# Patient Record
Sex: Female | Born: 1980 | Race: White | Hispanic: No | Marital: Single | Smoking: Never smoker
Health system: Southern US, Community
[De-identification: ages and names within clinical notes are randomized; demographics above are authoritative.]

## PROBLEM LIST (undated history)

## (undated) DIAGNOSIS — R519 Headache, unspecified: Secondary | ICD-10-CM

## (undated) DIAGNOSIS — M1990 Unspecified osteoarthritis, unspecified site: Secondary | ICD-10-CM

## (undated) DIAGNOSIS — I10 Essential (primary) hypertension: Secondary | ICD-10-CM

## (undated) DIAGNOSIS — F32A Depression, unspecified: Secondary | ICD-10-CM

## (undated) DIAGNOSIS — T8859XA Other complications of anesthesia, initial encounter: Secondary | ICD-10-CM

## (undated) DIAGNOSIS — F419 Anxiety disorder, unspecified: Secondary | ICD-10-CM

## (undated) HISTORY — PX: ABDOMINAL HYSTERECTOMY: SHX81

---

## 2004-12-21 ENCOUNTER — Emergency Department: Payer: Self-pay | Admitting: Unknown Physician Specialty

## 2004-12-23 ENCOUNTER — Emergency Department: Payer: Self-pay | Admitting: Unknown Physician Specialty

## 2004-12-27 ENCOUNTER — Ambulatory Visit: Payer: Self-pay

## 2005-01-03 ENCOUNTER — Ambulatory Visit: Payer: Self-pay

## 2005-01-07 ENCOUNTER — Emergency Department: Payer: Self-pay | Admitting: Emergency Medicine

## 2005-10-28 ENCOUNTER — Ambulatory Visit: Payer: Self-pay | Admitting: Obstetrics and Gynecology

## 2006-11-19 ENCOUNTER — Ambulatory Visit: Payer: Self-pay | Admitting: Obstetrics and Gynecology

## 2007-01-05 ENCOUNTER — Encounter: Payer: Self-pay | Admitting: Obstetrics and Gynecology

## 2009-11-20 ENCOUNTER — Encounter: Payer: Self-pay | Admitting: Obstetrics and Gynecology

## 2009-12-07 ENCOUNTER — Encounter: Payer: Self-pay | Admitting: Maternal & Fetal Medicine

## 2009-12-28 ENCOUNTER — Emergency Department: Payer: Self-pay | Admitting: Emergency Medicine

## 2010-01-04 ENCOUNTER — Encounter: Payer: Self-pay | Admitting: Maternal & Fetal Medicine

## 2010-01-05 ENCOUNTER — Encounter: Payer: Self-pay | Admitting: Maternal & Fetal Medicine

## 2010-02-05 ENCOUNTER — Encounter: Payer: Self-pay | Admitting: Obstetrics & Gynecology

## 2010-02-15 ENCOUNTER — Encounter: Payer: Self-pay | Admitting: Ophthalmology

## 2010-02-19 ENCOUNTER — Encounter: Payer: Self-pay | Admitting: Maternal and Fetal Medicine

## 2010-07-03 ENCOUNTER — Observation Stay: Payer: Self-pay | Admitting: Obstetrics and Gynecology

## 2010-07-05 ENCOUNTER — Observation Stay: Payer: Self-pay

## 2010-07-12 ENCOUNTER — Ambulatory Visit: Payer: Self-pay | Admitting: Obstetrics and Gynecology

## 2010-07-13 ENCOUNTER — Inpatient Hospital Stay: Payer: Self-pay | Admitting: Obstetrics and Gynecology

## 2011-07-09 HISTORY — PX: ABDOMINAL HYSTERECTOMY: SHX81

## 2011-09-05 ENCOUNTER — Encounter: Payer: Self-pay | Admitting: Obstetrics and Gynecology

## 2011-12-12 ENCOUNTER — Observation Stay: Payer: Self-pay | Admitting: Obstetrics and Gynecology

## 2012-02-17 ENCOUNTER — Observation Stay: Payer: Self-pay

## 2012-02-17 LAB — URINALYSIS, COMPLETE
Bilirubin,UR: NEGATIVE
Nitrite: NEGATIVE
Protein: NEGATIVE
WBC UR: 4 /HPF (ref 0–5)

## 2012-02-18 ENCOUNTER — Observation Stay: Payer: Self-pay | Admitting: Obstetrics and Gynecology

## 2012-02-19 LAB — URINE CULTURE

## 2012-02-21 IMAGING — US ULTRAOUND OB LIMITED - NRPT [facility]
1 series · 14 of 21 positions shown · non-contrast
Comparison: none

[Series 1: ultraound ob limited - nrpt [facility] · 14 of 21 slices shown]
[im 1/21]
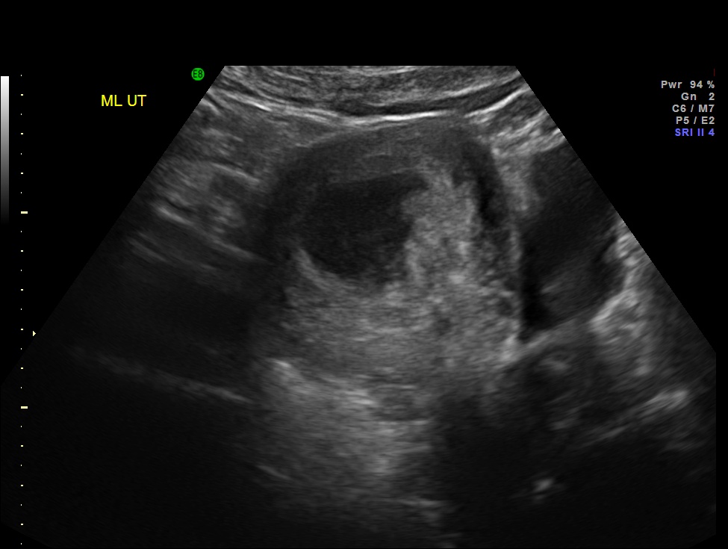
[im 3/21]
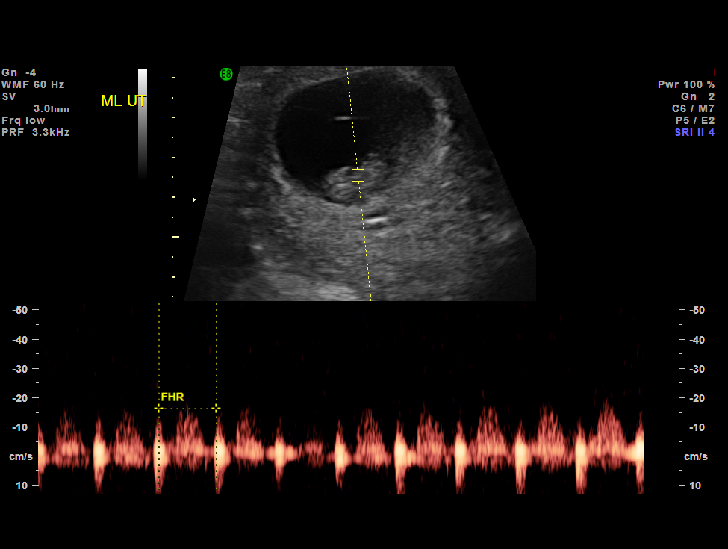
[im 4/21]
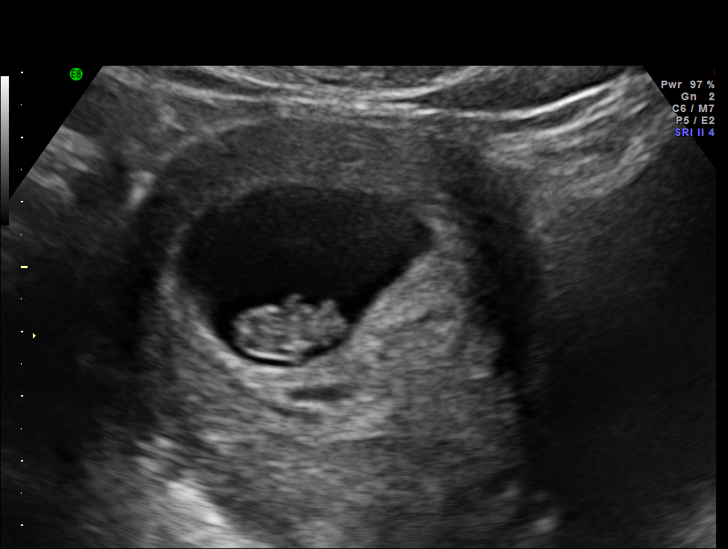
[im 6/21]
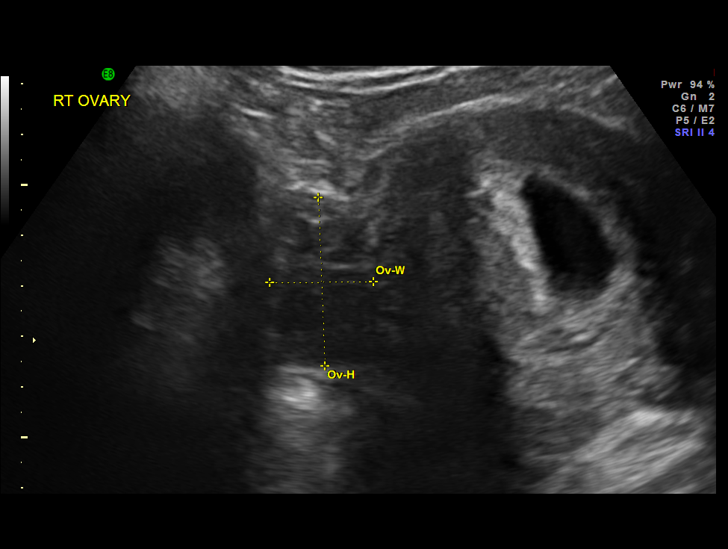
[im 7/21]
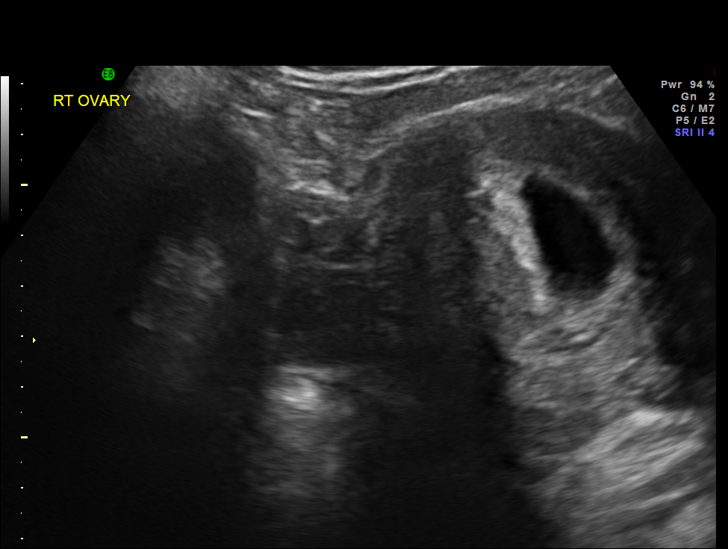
[im 9/21]
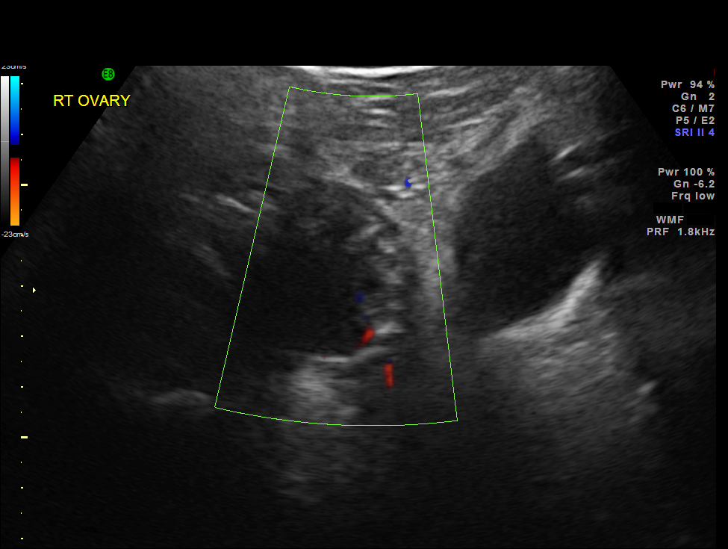
[im 10/21]
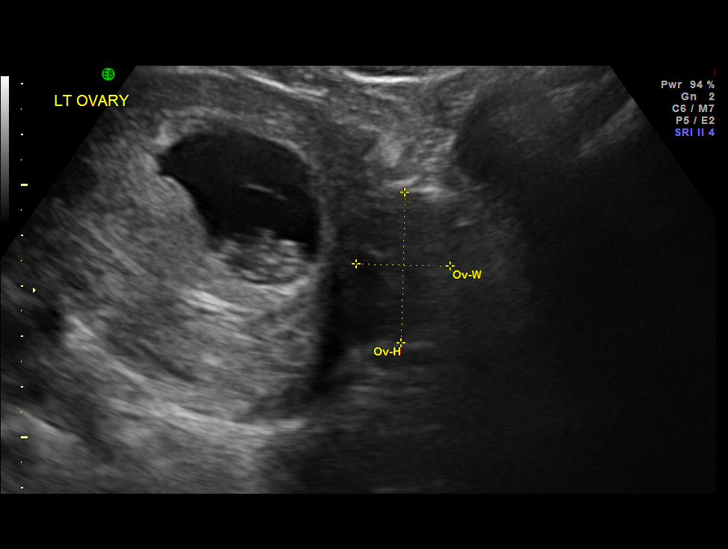
[im 12/21]
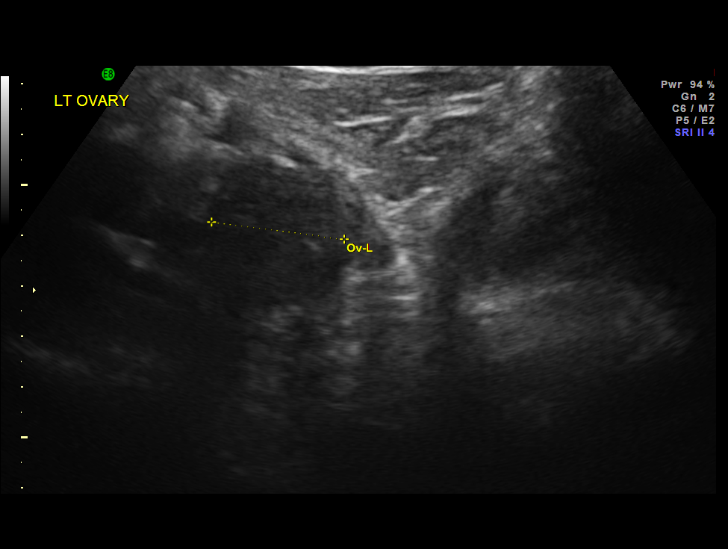
[im 13/21]
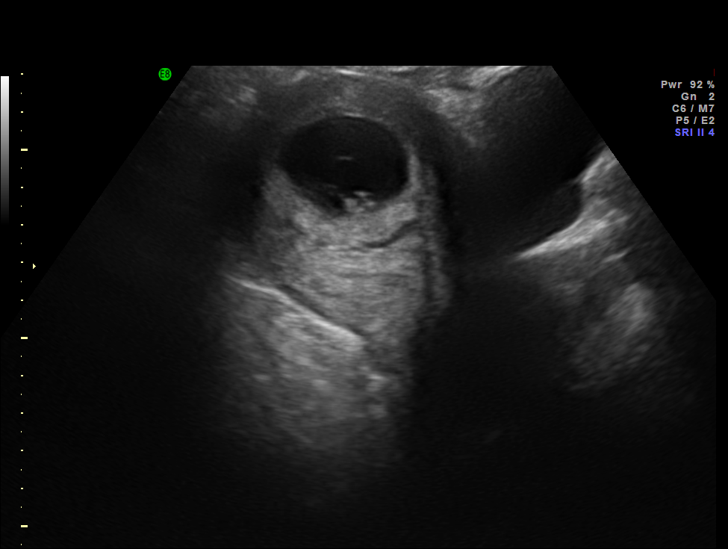
[im 15/21]
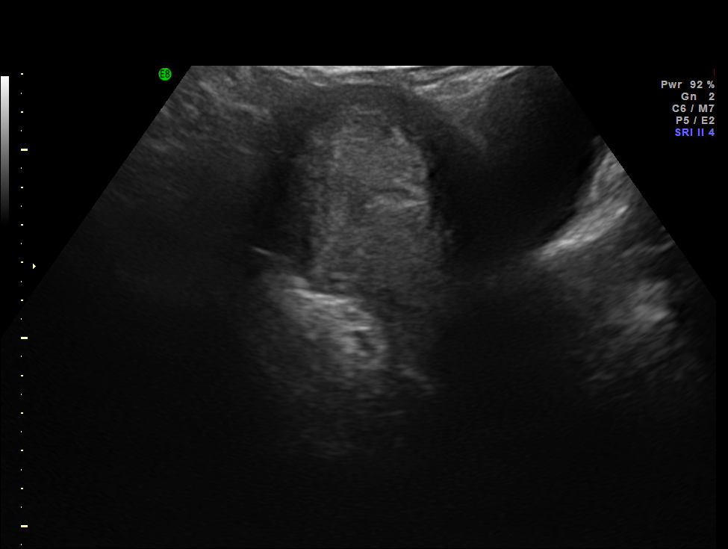
[im 16/21]
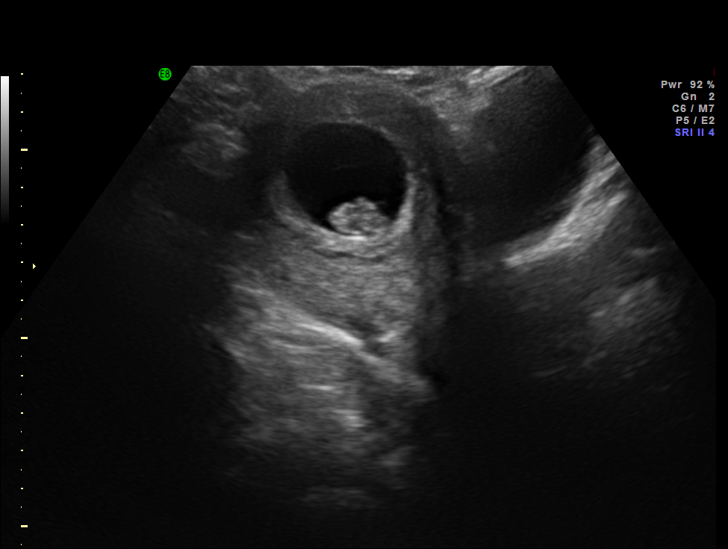
[im 18/21]
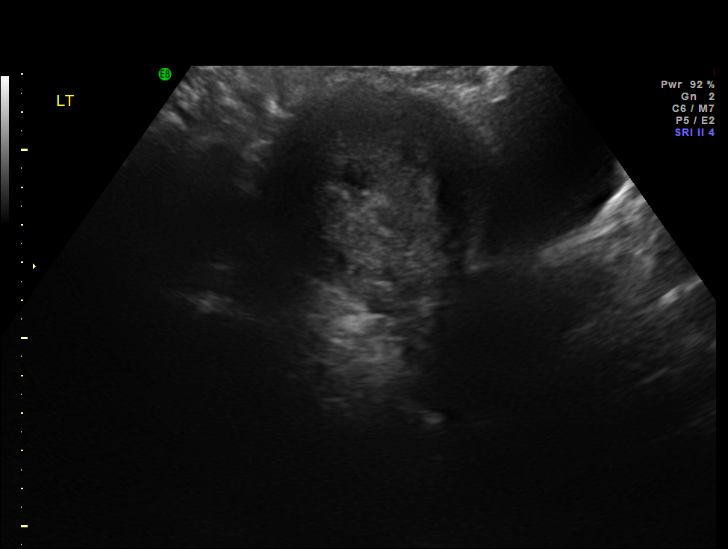
[im 19/21]
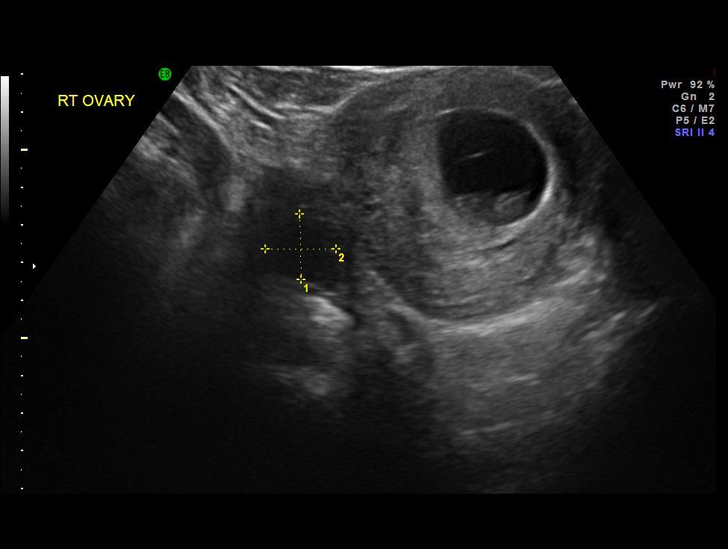
[im 21/21]
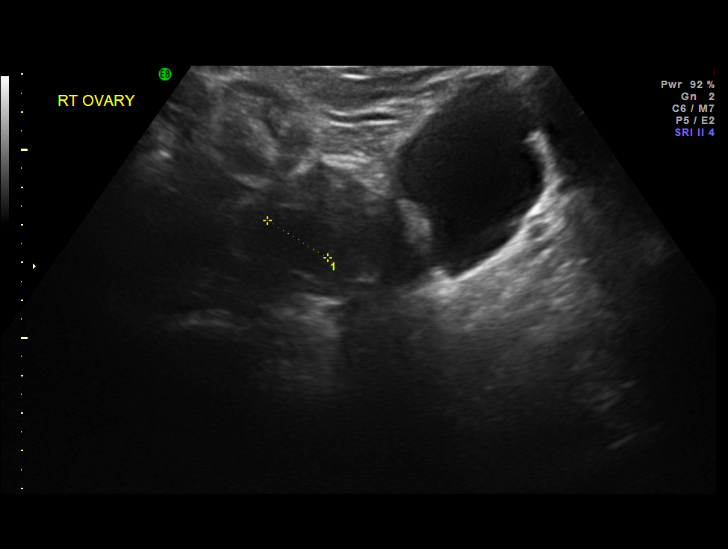

[14 of 21 positions shown; findings below may reference images not displayed]

IMAGES IMPORTED FROM THE [employer] WORKFLOW SYSTEM
NO DICTATION FOR STUDY

## 2012-03-02 ENCOUNTER — Ambulatory Visit: Payer: Self-pay | Admitting: Obstetrics and Gynecology

## 2012-03-02 LAB — CBC WITH DIFFERENTIAL/PLATELET
Basophil #: 0 10*3/uL (ref 0.0–0.1)
Basophil %: 0.2 %
HCT: 28.6 % — ABNORMAL LOW (ref 35.0–47.0)
HGB: 9.7 g/dL — ABNORMAL LOW (ref 12.0–16.0)
Lymphocyte #: 1.3 10*3/uL (ref 1.0–3.6)
MCH: 28.6 pg (ref 26.0–34.0)
MCV: 85 fL (ref 80–100)
Monocyte #: 0.4 x10 3/mm (ref 0.2–0.9)
Monocyte %: 5.5 %
Neutrophil #: 5.3 10*3/uL (ref 1.4–6.5)
Neutrophil %: 74.7 %
Platelet: 126 10*3/uL — ABNORMAL LOW (ref 150–440)
RBC: 3.38 10*6/uL — ABNORMAL LOW (ref 3.80–5.20)
RDW: 15 % — ABNORMAL HIGH (ref 11.5–14.5)
WBC: 7.1 10*3/uL (ref 3.6–11.0)

## 2012-03-04 ENCOUNTER — Inpatient Hospital Stay: Payer: Self-pay | Admitting: Obstetrics and Gynecology

## 2012-03-20 IMAGING — US US OB NUCHAL TRANSLUCENCY 1ST GEST - [facility] NRPT
1 series · 14 of 28 positions shown · non-contrast
Comparison: none

[Series 1: us ob nuchal translucency 1st gest - [facility] nrpt · 14 of 47 slices shown]
[im 2/47]
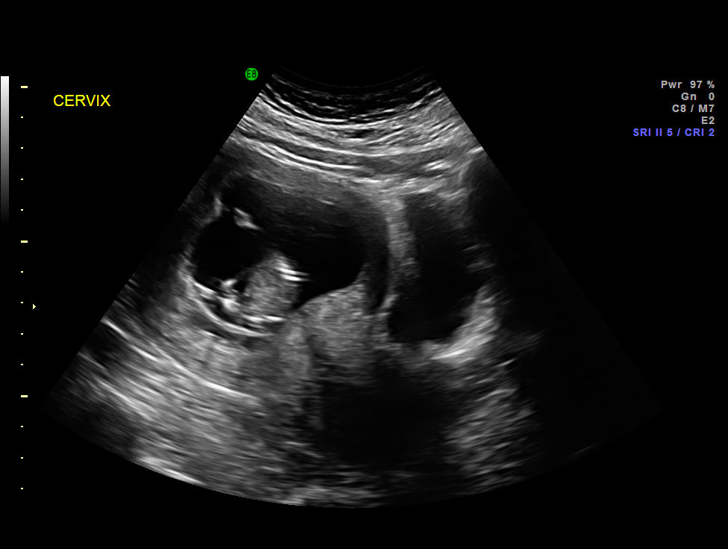
[im 6/47]
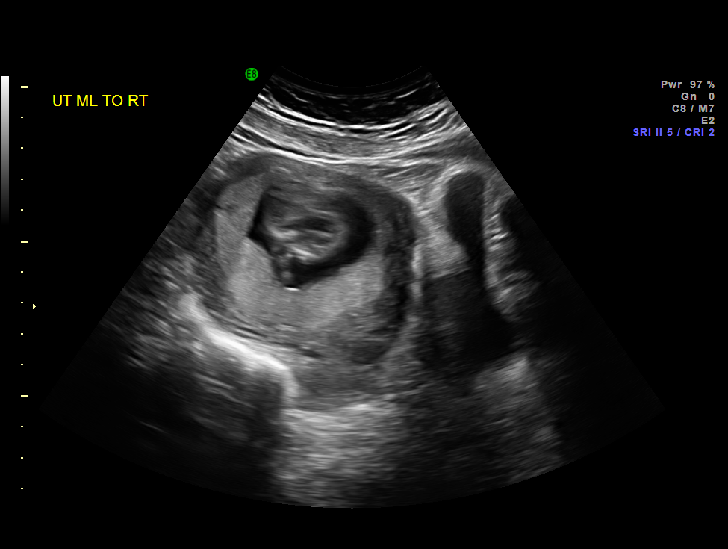
[im 9/47]
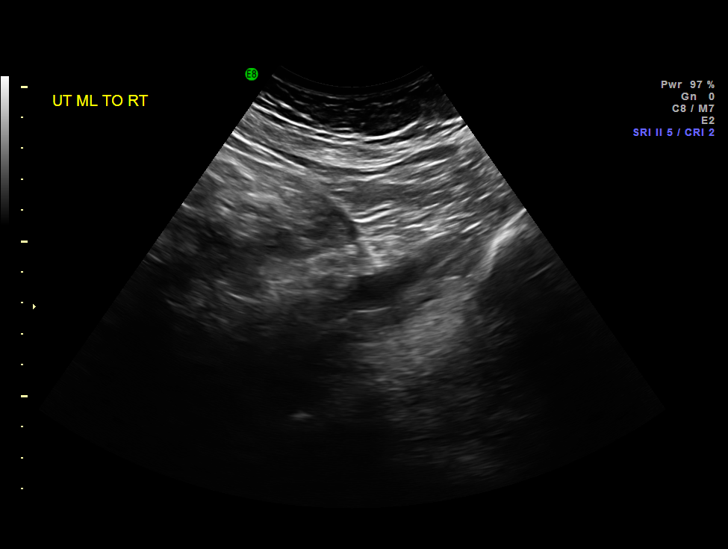
[im 12/47]
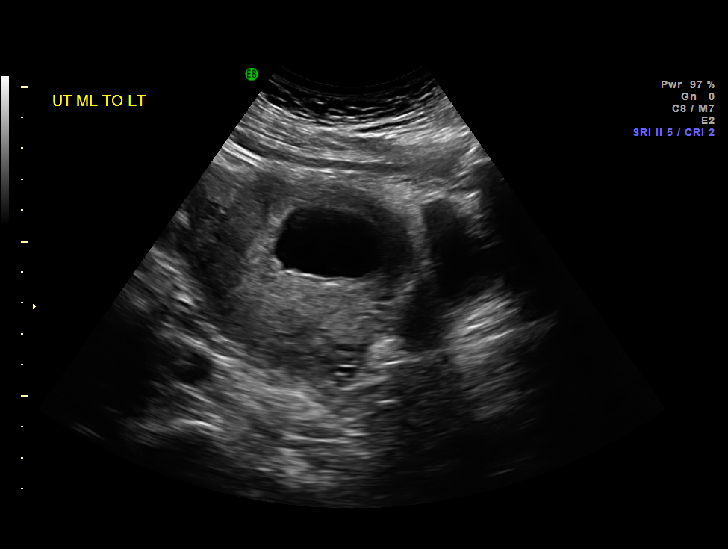
[im 16/47]
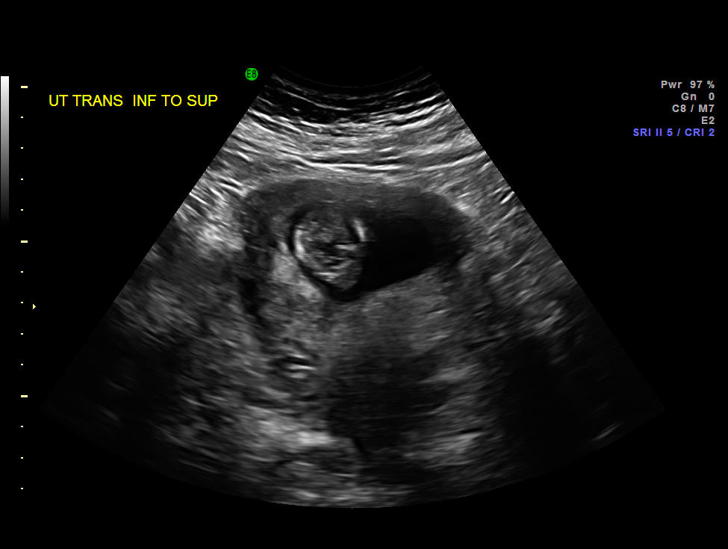
[im 19/47]
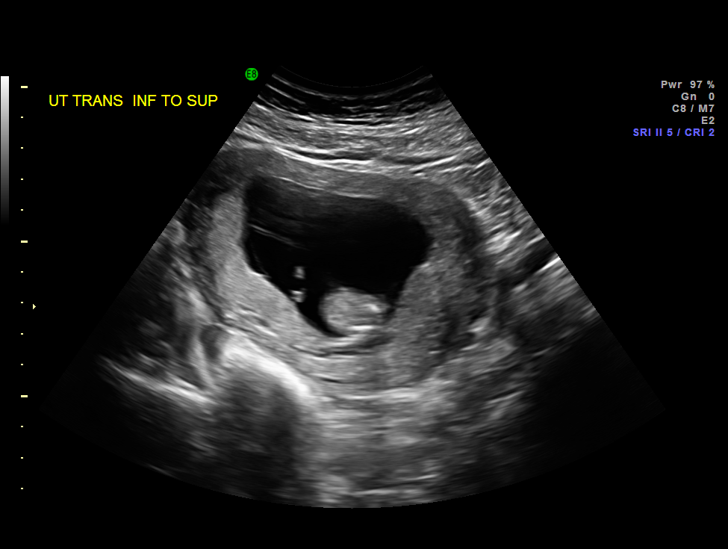
[im 23/47]
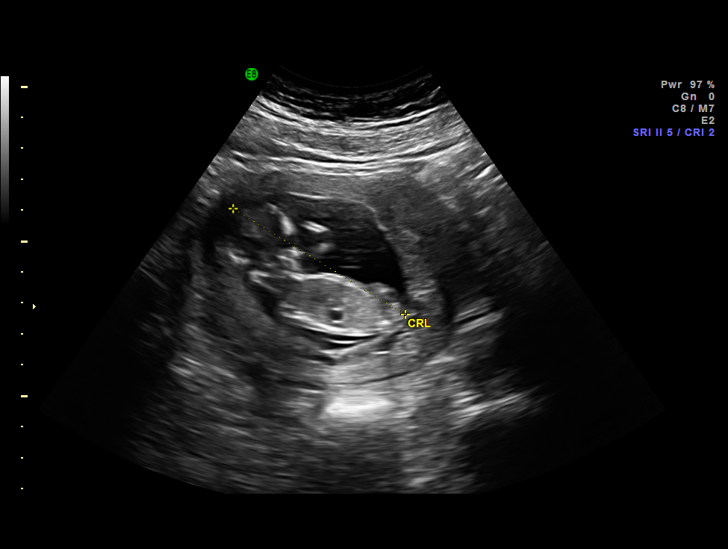
[im 26/47]
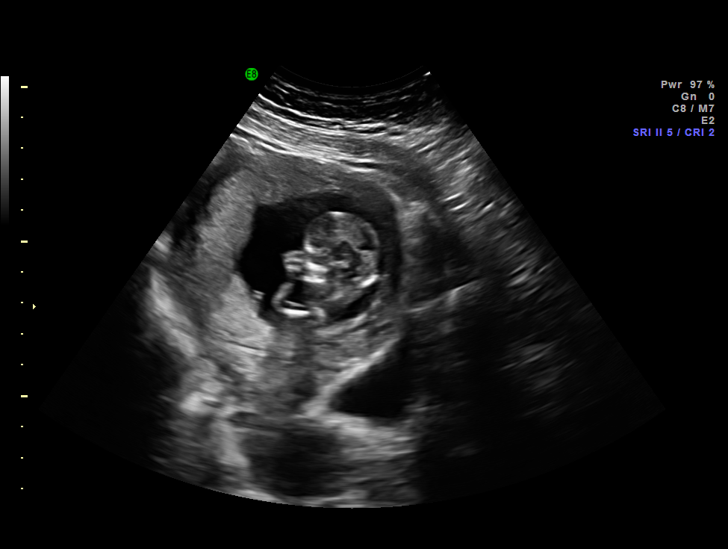
[im 29/47]
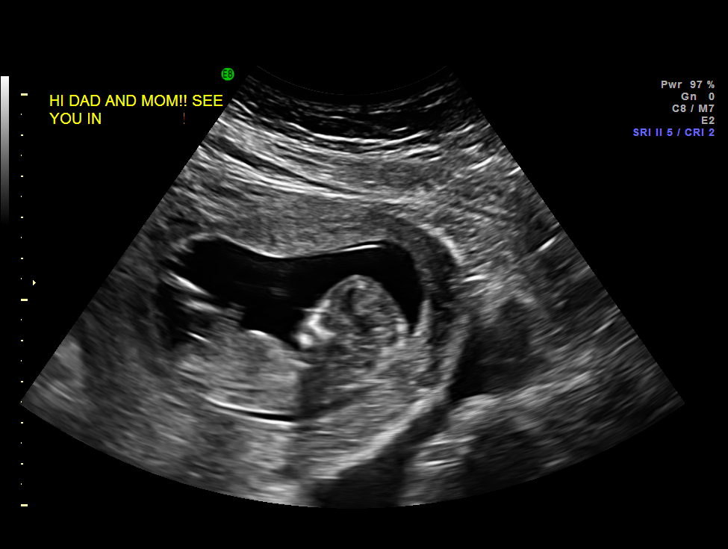
[im 33/47]
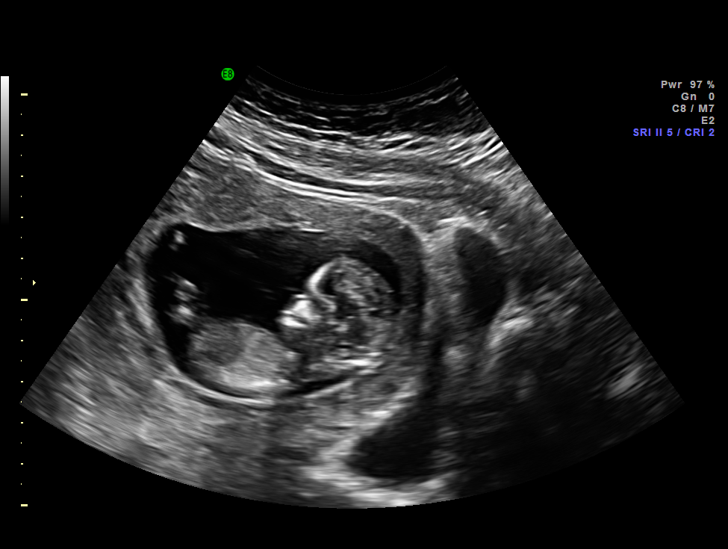
[im 36/47]
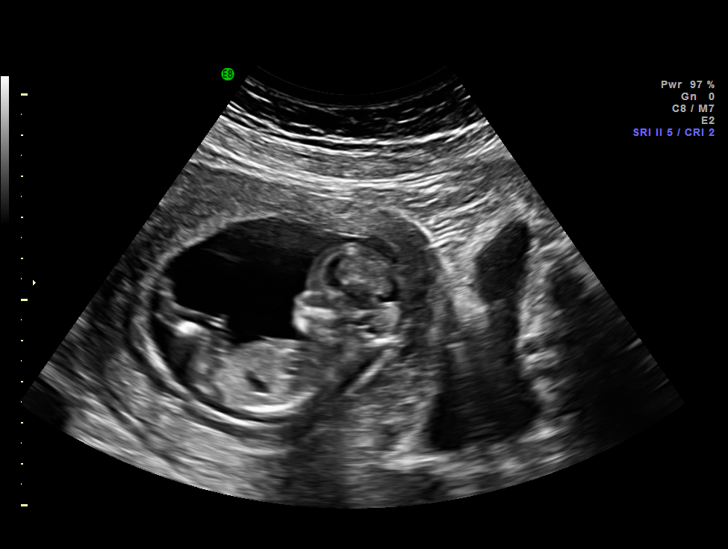
[im 40/47]
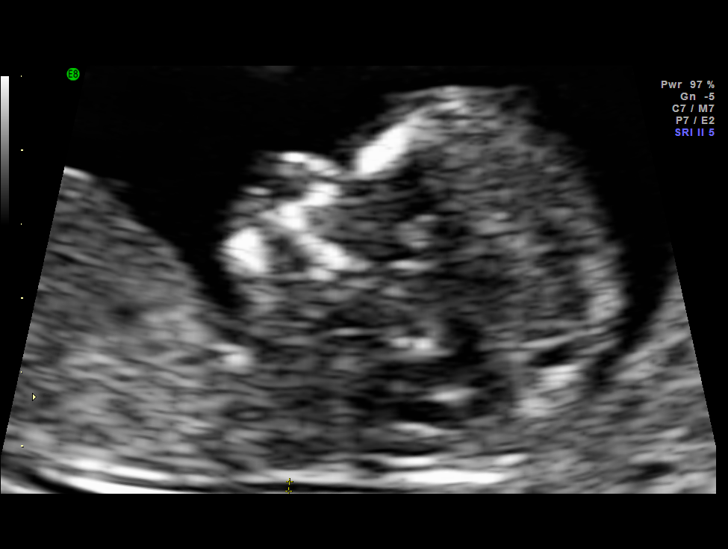
[im 43/47]
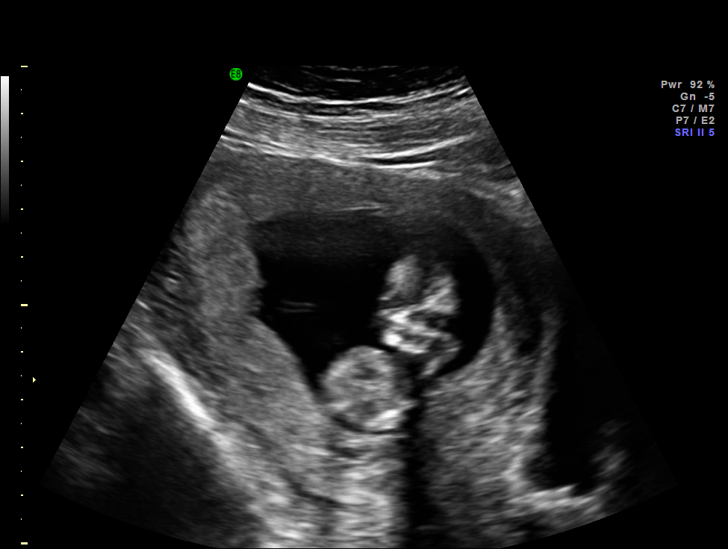
[im 47/47]
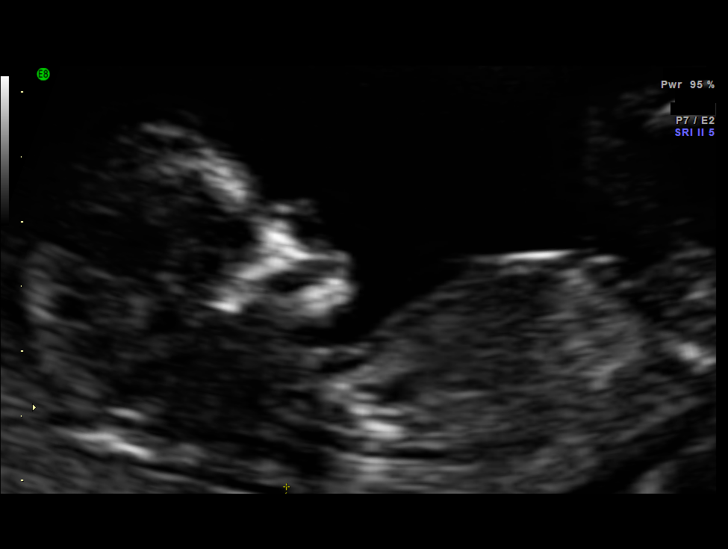

[14 of 28 positions shown; findings below may reference images not displayed]

IMAGES IMPORTED FROM THE [employer] WORKFLOW SYSTEM
NO DICTATION FOR STUDY

## 2013-05-18 ENCOUNTER — Ambulatory Visit: Payer: Self-pay | Admitting: Obstetrics and Gynecology

## 2013-05-18 LAB — URINALYSIS, COMPLETE
Glucose,UR: NEGATIVE mg/dL (ref 0–75)
Ketone: NEGATIVE
Nitrite: NEGATIVE
RBC,UR: <1 /HPF (ref 0–5)
Squamous Epithelial: 2
WBC UR: 3 /HPF (ref 0–5)

## 2013-05-24 ENCOUNTER — Ambulatory Visit: Payer: Self-pay | Admitting: Obstetrics and Gynecology

## 2013-05-25 LAB — PATHOLOGY REPORT

## 2013-11-02 ENCOUNTER — Emergency Department: Payer: Self-pay | Admitting: Emergency Medicine

## 2014-10-25 NOTE — Discharge Summary (Signed)
PATIENT NAME:  Wendy Phillips, Wendy Phillips MR#:  [id number] DATE OF BIRTH:  [birth year]  DATE OF ADMISSION:  03/04/2012 DATE OF DISCHARGE:  03/07/2012  PRINCIPLE PROCEDURES:  1. Elective repeat cesarean section. 2. Bilateral tubal ligation.   HOSPITAL COURSE: Hospital course uncomplicated. Postoperative day #1 hematocrit 23.5%. She is discharged to home on postoperative day #2 in good condition. Vital signs stable.   DISCHARGE MEDICATIONS:  1. Percocet. 2. Prenatal vitamins.   FOLLOWUP: She will follow up with Dr. Ouida Sills in two weeks for wound check or before if she has wound drainage, fevers, nausea, or vomiting.    ____________________________ Boykin Nearing, MD tjs:ap D: 03/13/2012 09:19:05 ET T: 03/16/2012 11:02:57 ET JOB#: [id number]  cc: Boykin Nearing, MD, <Dictator> Boykin Nearing MD ELECTRONICALLY SIGNED 03/17/2012 19:36

## 2014-10-25 NOTE — Op Note (Signed)
PATIENT NAME:  Wendy Phillips, Wendy Phillips MR#:  [id number] DATE OF BIRTH:  22-Apr-1981  DATE OF PROCEDURE:  03/04/2012  PREOPERATIVE DIAGNOSES:  1. Elective repeat cesarean section.  2. Elective permanent sterilization.   POSTOPERATIVE DIAGNOSES:    1. Elective repeat cesarean section.  2. Elective permanent sterilization.   PROCEDURE:  1. Repeat low transverse cesarean section.  2. Pomeroy bilateral tubal ligation.  3. On-Q pump placement.   SURGEON: Boykin Nearing, M.D.   FIRST ASSISTANT: Glenis Smoker, CNM   ANESTHESIA:  Spinal.  PROCEDURE: After adequate spinal anesthesia, the patient was placed in the dorsal supine position with a hip roll under the right side. The patient's abdomen was prepped and draped in normal sterile fashion. A Pfannenstiel incision was made two fingerbreadths above the symphysis pubis. Sharp dissection was used to identify the fascia. The fascia was opened in the midline and opened in a transverse fashion. The superior aspect of the fascia was grasped with Kocher  clamps and the recti muscles dissected free. The inferior aspect of the fascia was grasped with Kocher clamps and the pyramidalis muscle was dissected free. Entry into the peritoneal cavity was accomplished sharply. Several adhesions were noted of the vesicouterine peritoneal fold. These were taken down sharply. The bladder flap was then created and the bladder was reflected inferiorly. A low transverse uterine incision was made. Upon entry into the endometrial cavity, clear fluid resulted. The fetal head was delivered through the incision without difficulty, and shoulders and body delivered without difficulty. The infant was passed to Dr. Tona Sensing, after clamping the cord. Apgars 9 and 9. The uterus was exteriorized. The placenta was removed manually and the endometrial cavity was wiped clean with laparotomy tape. The cervix was opened with a ring forceps and this was passed off the operative field. The uterine  incision was closed with 1 chromic suture in a running locking fashion with good approximation of edges. Good hemostasis was noted. Attention was directed to the patient's right fallopian tube which was grasped at the midportion of the tube and two separate 0 plain gut sutures were applied and a 1.5-cm portion of the fallopian tube removed. A similar procedure was repeated on the patient's left fallopian tube. After placing 2-0 plain gut sutures, a 1.5-cm portion of the fallopian tube was removed. Good hemostasis was noted. The posterior cul-de-sac was suctioned and the uterus was placed back into the abdominal cavity. The paracolic gutter was wiped clean with the laparotomy tape.  The tubal ligation sites appeared hemostatic and the uterine incision appeared hemostatic. Interceed was placed over the uterine scar in a T-shaped fashion. Of note, there were some omental adhesions that were taken down sharply from the uterus. The On-Q pump was then placed. Catheters were fed infraumbilically subfascially. The fascia was then closed above these catheters. The fascia was intact. The subcutaneous tissues were irrigated and bovied for hemostasis and skin was then reapproximated with the Insorb absorbable staples and the On-Q pump catheters were then secured with Dermabond and Steri-Strips and Tegaderm was placed above this. Each catheter was then loaded with 5 mL of 0.5% Marcaine. There were no complications.  Estimated blood loss was 450 mL.  The patient tolerated the procedure well and was taken to the recovery room in good condition.    ____________________________ Boykin Nearing, MD tjs:bjt D: 03/04/2012 08:47:18 ET T: 03/04/2012 11:09:06 ET JOB#: [id number]  cc: Boykin Nearing, MD, <Dictator> Boykin Nearing MD ELECTRONICALLY SIGNED 03/05/2012 11:28

## 2014-10-28 NOTE — Discharge Summary (Signed)
Dates of Admission and Diagnosis:  Date of Admission 24-May-2013   Date of Discharge 25-May-2013   Admitting Diagnosis polymen/mennorhagia   Final Diagnosis same    Chief Complaint/History of Present Illness LSH for above, with LOA   Hospital Course:  Hospital Course nausea morning of day 1 , resolved   Condition on Discharge Good   Physician's Instructions:  Diet Regular   Activity Limitations No exertional activity  No heavy lifting   Return to Work 2 weeks   Time frame for Follow Up Appointment 1-2 weeks   Electronic Signatures: Edison Nasuti (MD)  (Signed [phone] 14:03)  Authored: ADMISSION DATE AND DIAGNOSIS, CHIEF COMPLAINT/HPI, HOSPITAL COURSE, PATIENT INSTRUCTIONS   Last Updated: 18-Nov-14 14:03 by Edison Nasuti (MD)

## 2014-10-28 NOTE — Op Note (Signed)
PATIENT NAME:  Wendy Phillips, Wendy Phillips MR#:  [id number] DATE OF BIRTH:  1980/11/18  DATE OF PROCEDURE:  05/24/2013  PREOPERATIVE DIAGNOSES: 1.  Dysmenorrhea. 2.  Menorrhagia.  3.  Right-sided pain, normal Paps, normal anatomy.  POSTOPERATIVE DIAGNOSES: 1.  Dysmenorrhea. 2.  Menorrhagia.  3.  Right-sided pain, normal Paps, normal anatomy.  PROCEDURE:  1.  Laparoscopic supracervical hysterectomy. 2.  Bilateral salpingectomy. 3.  Lysis of adhesions. 4.  Cystoscopy.   SURGEON: Ricky L. Amalia Hailey, M.D.   ASSISTANT: Dr. Ouida Sills.   ANESTHESIA: General endotracheal.   ESTIMATED BLOOD LOSS: 350 mL.   COMPLICATIONS: None.   DRAINS: Foley.   PROCEDURE IN DETAIL: The patient was consented, taken to the operating room and placed in the supine position where anesthesia was initiated and placed in the dorsal lithotomy position in Garrison stirrups, prepped and draped in the usual sterile fashion. Cervix was visualized. A single-tooth and sound were placed in the usual fashion. Foley catheter was placed.   A transverse 1 cm infraumbilical incision was made through which an 11 mm port was placed and pneumoperitoneum was established. The patient was placed in Trendelenburg position. Under direct visualization, I placed 11 ports in bilateral lower quadrants. I immediately noted adherent left adnexa and adhesions from the mid anterior uterus down toward the cervix.   Round ligament was identified and divided as was the uteroovarian ligament. Staying close to the uterus, I dissected down along the broad ligament and cauterized the uterine artery. I carefully dissected free from the anterior adhesions taking care to stay away from the bladder.  Proceeding in a similar fashion on the right, I cauterized the right uterine artery. Entry was made and a stump was created on the right.   Sound was removed and uterus was cut free from underlying cervical stump.   There was persistent oozing near the left  uterosacral, which was controlled with light electrocautery with a Kleppinger and also with the Harmonic scalpel.   We then removed bilateral fallopian tubes using Harmonic scalpel.   The uterus was removed in morcellator fashion after morcellator was placed through the left lower port site wound. This was all sent for permanent pathology together.   There was persistent oozing from around the cervix. The endocervical canal was then cauterized with hand of operator in the vagina to ensure no upper vaginal trauma. There was no vigorous bleeding noted at this time. Surgifoam was placed.   I asked for IV indigo carmine to be given - indigo carmine is on a back course and methylene blue was given one half of a vial.   Cystoscopy was then carried out, which showed good spillage of methylene blue from bilateral internal ureteral orifices with no evidence of bladder trauma.   Ports were removed. The areas seemed to be hemostatic. Incisions were closed with deep of 0 Vicryl. Steri-Strips and Band-Aids were applied.   I had asked the patient preoperatively to mark the area near her incision that was tender. I did inject 30 mL of 1% lidocaine with epinephrine using an 18-gauge needle bevel to release some of the scar tissue. It did appear to be somewhat tight from her previous cesarean section. I did this at the end of the case.   All instrument, needle, and sponge counts were correct. The patient tolerated the procedure well. I anticipate a routine postoperative course. We will leave the Foley in at least until this afternoon given the fact that she had regular oozing throughout the case.  ____________________________ Rockey Situ. Amalia Hailey, MD rle:aw D: 05/24/2013 11:58:45 ET T: 05/24/2013 12:21:37 ET JOB#: [id number]  cc: Ricky L. Amalia Hailey, MD, <Dictator> Selmer Dominion MD ELECTRONICALLY SIGNED 05/24/2013 14:36

## 2014-11-01 ENCOUNTER — Ambulatory Visit (INDEPENDENT_AMBULATORY_CARE_PROVIDER_SITE_OTHER): Payer: 59

## 2014-11-01 ENCOUNTER — Encounter: Payer: Self-pay | Admitting: Podiatry

## 2014-11-01 ENCOUNTER — Ambulatory Visit (INDEPENDENT_AMBULATORY_CARE_PROVIDER_SITE_OTHER): Payer: 59 | Admitting: Podiatry

## 2014-11-01 VITALS — BP 127/90 | HR 89 | Resp 16 | Ht 62.0 in | Wt 170.0 lb

## 2014-11-01 DIAGNOSIS — S93601A Unspecified sprain of right foot, initial encounter: Secondary | ICD-10-CM

## 2014-11-01 DIAGNOSIS — S92301A Fracture of unspecified metatarsal bone(s), right foot, initial encounter for closed fracture: Secondary | ICD-10-CM | POA: Diagnosis not present

## 2014-11-01 NOTE — Progress Notes (Signed)
Subjective:     Patient ID: Wendy Phillips, female   DOB: [birth year], 34 y.o.   MRN: [id number]  HPI 34 year old female presents the office they with complaints of right foot pain. She states that yesterday she fell into a hole in her right foot. She states that last night she didn't go to urgent care and she was told they were going to send the x-rays off and to follow-up with [place] today. She states that she is unable to bear weight to her foot due to the pain. She also states the foot feels somewhat numb since the injury. Wrapping in an ACE bandage helps. She has been taking OTC anti-inflammatories and icing the area. No other injuries at the time of the accident. No other complaints at this time.  Review of Systems  All other systems reviewed and are negative.      Objective:   Physical Exam AAO x3, NAD DP/PT pulses palpable bilaterally, CRT less than 3 seconds Protective sensation intact with Simms Weinstein monofilament, Achilles tendon reflex intact. There is subjective numbness to her foot mostly over the lateral aspect on the right.  There is tenderness to palpation overlying the lateral aspect of the foot particularly the fifth metatarsal base. There is mild discomfort along the course of the peroneal tendons inferior to lateral malleolus. There is mild discomfort with eversion. Peroneal tendons appear to be intact. There is also mild diffuse tenderness overlying the fourth metatarsal and lateral aspect of the foot. No other specific area pinpoint bony tenderness. There is mild edema overlying the lateral aspect of the foot as well without any associated erythema or increase in warmth.  No areas of tenderness to bilateral lower extremities. MMT 4/5 on the right and 5/5 on the left.  No open lesions or pre-ulcerative lesions.  No overlying edema, erythema, increase in warmth to bilateral lower extremities.  No pain with calf compression, swelling, warmth, erythema bilaterally.       Assessment:     34 year old female with right 5th metatarsal base avulsion fracture    Plan:     -X-rays were obtained and reviewed with the patient -Treatment options were discussed include alternatives, risks, competitions. -At this time recommended immobilization in a Cam Walker. She states that she has a boot at home and I recommended continue to wear this at all times. -She was given a prescription for pain medication urgent care; take this as needed -Ice to the area -Can try WBAT however at this time she states it is too painful; she presents today with crutches and she can continue this.  -Follow-up in 2 weeks, or sooner should any problems arise. In the meantime call with any questions/concerns/change in symptoms.

## 2014-11-11 ENCOUNTER — Telehealth: Payer: Self-pay | Admitting: Podiatry

## 2014-11-11 NOTE — Telephone Encounter (Signed)
Please call ASAP

## 2014-11-14 ENCOUNTER — Other Ambulatory Visit: Payer: Self-pay | Admitting: *Deleted

## 2014-11-14 MED ORDER — TRAMADOL HCL 50 MG PO TABS
50.0000 mg | ORAL_TABLET | Freq: Three times a day (TID) | ORAL | Status: DC | PRN
Start: 2014-11-14 — End: 2015-01-24

## 2014-11-22 ENCOUNTER — Ambulatory Visit (INDEPENDENT_AMBULATORY_CARE_PROVIDER_SITE_OTHER): Payer: 59

## 2014-11-22 ENCOUNTER — Ambulatory Visit (INDEPENDENT_AMBULATORY_CARE_PROVIDER_SITE_OTHER): Payer: 59 | Admitting: Podiatry

## 2014-11-22 VITALS — BP 125/87 | HR 89 | Resp 16

## 2014-11-22 DIAGNOSIS — S92301A Fracture of unspecified metatarsal bone(s), right foot, initial encounter for closed fracture: Secondary | ICD-10-CM

## 2014-11-22 DIAGNOSIS — S92302D Fracture of unspecified metatarsal bone(s), left foot, subsequent encounter for fracture with routine healing: Secondary | ICD-10-CM

## 2014-11-22 MED ORDER — MELOXICAM 7.5 MG PO TABS: 7.5000 mg | ORAL_TABLET | Freq: Every day | ORAL | Status: DC

## 2014-11-22 NOTE — Progress Notes (Signed)
Patient ID: Wendy Phillips, female   DOB: [birth year], 34 y.o.   MRN: [id number]  Subjective: 34 year old female presents the office they for followup evaluation of right fifth metatarsal base avulsion fracture. She states that she continues to wear the cam walker at all times. She does that her pain has improved although she does have some discomfort. She did say that she try to go back into a regular shoe however she was unable to due to the pain. She currently has no numbness the fifth toe however this does appear to be resolving. She continues with Ace wrap for swelling which helps. Denies any systemic complaints such as fevers, chills, nausea, vomiting. No acute changes since last appointment, and no other complaints at this time.   Objective: AAO x3, NAD DP/PT pulses palpable bilaterally, CRT less than 3 seconds Protective sensation intact with Simms Weinstein monofilament First interspace on the left fifth metatarsal base there is mild edema and ecchymosis overlying this area. There is no other areas of pinpoint bony tenderness or pain the vibratory sensation of bilateral lower extremity. No other areas of edema, erythema, increase in warmth.  MMT 5/5, ROM WNL. No open lesions or pre-ulcerative lesions.  No pain with calf compression, swelling, warmth, erythema  Assessment: 34 year old female left fifth metatarsal base avulsion fracture, improving  Plan: -x-rays were obtained and reviewed with the patient. -All treatment options discussed with the patient including all alternatives, risks, complications.  -At this time recommended continued immobilization in a Cam Walker due to pain. -Ice and elevation -Prescribed meloxicam. Discussed side effects the medication and directed to stop any are to occur call the office. -dispensed compression anklet. -Follow-up in 3 weeks. At that time repeat x-rays will be obtained. We'll likely transition back into a regular shoe if tolerated.  -Patient  encouraged to call the office with any questions, concerns, change in symptoms.

## 2014-11-24 ENCOUNTER — Ambulatory Visit: Payer: 59 | Admitting: Podiatry

## 2014-12-13 ENCOUNTER — Ambulatory Visit: Payer: 59 | Admitting: Podiatry

## 2014-12-29 ENCOUNTER — Telehealth: Payer: Self-pay | Admitting: Podiatry

## 2014-12-29 NOTE — Telephone Encounter (Signed)
PT CALLED ASKING TO BE SEEN TODAY. PT HAD FRACTURED HER RIGHT FOOT IN April AND IS STILL HAVING ISSUES WITH IT. PT TAKING TRAMADOL AND IT IS NOT HELPING WITH THE PAIN. SHE SAID WHEN SHE IS SITTING AND GETS UP TO WALK SHE IS IN A LOT OF PAIN. PT STATES SHE IS NOT SURE IF IT IS FROM FRACTURE OR FROM PLANTAR FASCITIS BUT WANTS TO BE SEEN TODAY. I OFFERED HER AN APPT FOR Tuesday BUT PT SAID SHE COULD NOT WAIT.

## 2015-01-03 ENCOUNTER — Ambulatory Visit (INDEPENDENT_AMBULATORY_CARE_PROVIDER_SITE_OTHER): Payer: 59 | Admitting: Podiatry

## 2015-01-03 ENCOUNTER — Encounter: Payer: Self-pay | Admitting: Podiatry

## 2015-01-03 ENCOUNTER — Ambulatory Visit (INDEPENDENT_AMBULATORY_CARE_PROVIDER_SITE_OTHER): Payer: 59

## 2015-01-03 VITALS — BP 113/77 | HR 89 | Resp 16 | Ht 62.0 in | Wt 180.0 lb

## 2015-01-03 DIAGNOSIS — M779 Enthesopathy, unspecified: Secondary | ICD-10-CM

## 2015-01-03 DIAGNOSIS — M722 Plantar fascial fibromatosis: Secondary | ICD-10-CM | POA: Diagnosis not present

## 2015-01-03 DIAGNOSIS — M79671 Pain in right foot: Secondary | ICD-10-CM

## 2015-01-03 DIAGNOSIS — S92302D Fracture of unspecified metatarsal bone(s), left foot, subsequent encounter for fracture with routine healing: Secondary | ICD-10-CM | POA: Diagnosis not present

## 2015-01-03 MED ORDER — TRIAMCINOLONE ACETONIDE 10 MG/ML IJ SUSP
10.0000 mg | Freq: Once | INTRAMUSCULAR | Status: AC
  Administered 2015-01-03: 10 mg

## 2015-01-03 NOTE — Patient Instructions (Signed)
Plantar Fasciitis (Heel Spur Syndrome) with Rehab The plantar fascia is a fibrous, ligament-like, soft-tissue structure that spans the bottom of the foot. Plantar fasciitis is a condition that causes pain in the foot due to inflammation of the tissue. SYMPTOMS   Pain and tenderness on the underneath side of the foot.  Pain that worsens with standing or walking. CAUSES  Plantar fasciitis is caused by irritation and injury to the plantar fascia on the underneath side of the foot. Common mechanisms of injury include:  Direct trauma to bottom of the foot.  Damage to a small nerve that runs under the foot where the main fascia attaches to the heel bone.  Stress placed on the plantar fascia due to bone spurs. RISK INCREASES WITH:   Activities that place stress on the plantar fascia (running, jumping, pivoting, or cutting).  Poor strength and flexibility.  Improperly fitted shoes.  Tight calf muscles.  Flat feet.  Failure to warm-up properly before activity.  Obesity. PREVENTION  Warm up and stretch properly before activity.  Allow for adequate recovery between workouts.  Maintain physical fitness:  Strength, flexibility, and endurance.  Cardiovascular fitness.  Maintain a health body weight.  Avoid stress on the plantar fascia.  Wear properly fitted shoes, including arch supports for individuals who have flat feet. PROGNOSIS  If treated properly, then the symptoms of plantar fasciitis usually resolve without surgery. However, occasionally surgery is necessary. RELATED COMPLICATIONS   Recurrent symptoms that may result in a chronic condition.  Problems of the lower back that are caused by compensating for the injury, such as limping.  Pain or weakness of the foot during push-off following surgery.  Chronic inflammation, scarring, and partial or complete fascia tear, occurring more often from repeated injections. TREATMENT  Treatment initially involves the use of  ice and medication to help reduce pain and inflammation. The use of strengthening and stretching exercises may help reduce pain with activity, especially stretches of the Achilles tendon. These exercises may be performed at home or with a therapist. Your caregiver may recommend that you use heel cups of arch supports to help reduce stress on the plantar fascia. Occasionally, corticosteroid injections are given to reduce inflammation. If symptoms persist for greater than 6 months despite non-surgical (conservative), then surgery may be recommended.  MEDICATION   If pain medication is necessary, then nonsteroidal anti-inflammatory medications, such as aspirin and ibuprofen, or other minor pain relievers, such as acetaminophen, are often recommended.  Do not take pain medication within 7 days before surgery.  Prescription pain relievers may be given if deemed necessary by your caregiver. Use only as directed and only as much as you need.  Corticosteroid injections may be given by your caregiver. These injections should be reserved for the most serious cases, because they may only be given a certain number of times. HEAT AND COLD  Cold treatment (icing) relieves pain and reduces inflammation. Cold treatment should be applied for 10 to 15 minutes every 2 to 3 hours for inflammation and pain and immediately after any activity that aggravates your symptoms. Use ice packs or massage the area with a piece of ice (ice massage).  Heat treatment may be used prior to performing the stretching and strengthening activities prescribed by your caregiver, physical therapist, or athletic trainer. Use a heat pack or soak the injury in warm water. SEEK IMMEDIATE MEDICAL CARE IF:  Treatment seems to offer no benefit, or the condition worsens.  Any medications produce adverse side effects. EXERCISES RANGE   OF MOTION (ROM) AND STRETCHING EXERCISES - Plantar Fasciitis (Heel Spur Syndrome) These exercises may help you  when beginning to rehabilitate your injury. Your symptoms may resolve with or without further involvement from your physician, physical therapist or athletic trainer. While completing these exercises, remember:   Restoring tissue flexibility helps normal motion to return to the joints. This allows healthier, less painful movement and activity.  An effective stretch should be held for at least 30 seconds.  A stretch should never be painful. You should only feel a gentle lengthening or release in the stretched tissue. RANGE OF MOTION - Toe Extension, Flexion  Sit with your right / left leg crossed over your opposite knee.  Grasp your toes and gently pull them back toward the top of your foot. You should feel a stretch on the bottom of your toes and/or foot.  Hold this stretch for __________ seconds.  Now, gently pull your toes toward the bottom of your foot. You should feel a stretch on the top of your toes and or foot.  Hold this stretch for __________ seconds. Repeat __________ times. Complete this stretch __________ times per day.  RANGE OF MOTION - Ankle Dorsiflexion, Active Assisted  Remove shoes and sit on a chair that is preferably not on a carpeted surface.  Place right / left foot under knee. Extend your opposite leg for support.  Keeping your heel down, slide your right / left foot back toward the chair until you feel a stretch at your ankle or calf. If you do not feel a stretch, slide your bottom forward to the edge of the chair, while still keeping your heel down.  Hold this stretch for __________ seconds. Repeat __________ times. Complete this stretch __________ times per day.  STRETCH - Gastroc, Standing  Place hands on wall.  Extend right / left leg, keeping the front knee somewhat bent.  Slightly point your toes inward on your back foot.  Keeping your right / left heel on the floor and your knee straight, shift your weight toward the wall, not allowing your back to  arch.  You should feel a gentle stretch in the right / left calf. Hold this position for __________ seconds. Repeat __________ times. Complete this stretch __________ times per day. STRETCH - Soleus, Standing  Place hands on wall.  Extend right / left leg, keeping the other knee somewhat bent.  Slightly point your toes inward on your back foot.  Keep your right / left heel on the floor, bend your back knee, and slightly shift your weight over the back leg so that you feel a gentle stretch deep in your back calf.  Hold this position for __________ seconds. Repeat __________ times. Complete this stretch __________ times per day. STRETCH - Gastrocsoleus, Standing  Note: This exercise can place a lot of stress on your foot and ankle. Please complete this exercise only if specifically instructed by your caregiver.   Place the ball of your right / left foot on a step, keeping your other foot firmly on the same step.  Hold on to the wall or a rail for balance.  Slowly lift your other foot, allowing your body weight to press your heel down over the edge of the step.  You should feel a stretch in your right / left calf.  Hold this position for __________ seconds.  Repeat this exercise with a slight bend in your right / left knee. Repeat __________ times. Complete this stretch __________ times per day.    STRENGTHENING EXERCISES - Plantar Fasciitis (Heel Spur Syndrome)  These exercises may help you when beginning to rehabilitate your injury. They may resolve your symptoms with or without further involvement from your physician, physical therapist or athletic trainer. While completing these exercises, remember:   Muscles can gain both the endurance and the strength needed for everyday activities through controlled exercises.  Complete these exercises as instructed by your physician, physical therapist or athletic trainer. Progress the resistance and repetitions only as guided. STRENGTH -  Towel Curls  Sit in a chair positioned on a non-carpeted surface.  Place your foot on a towel, keeping your heel on the floor.  Pull the towel toward your heel by only curling your toes. Keep your heel on the floor.  If instructed by your physician, physical therapist or athletic trainer, add ____________________ at the end of the towel. Repeat __________ times. Complete this exercise __________ times per day. STRENGTH - Ankle Inversion  Secure one end of a rubber exercise band/tubing to a fixed object (table, pole). Loop the other end around your foot just before your toes.  Place your fists between your knees. This will focus your strengthening at your ankle.  Slowly, pull your big toe up and in, making sure the band/tubing is positioned to resist the entire motion.  Hold this position for __________ seconds.  Have your muscles resist the band/tubing as it slowly pulls your foot back to the starting position. Repeat __________ times. Complete this exercises __________ times per day.  Document Released: 06/24/2005 Document Revised: 09/16/2011 Document Reviewed: 10/06/2008 [employer] Patient Information 2015 [employer]. This information is not intended to replace advice given to you by your health care provider. Make sure you discuss any questions you have with your health care provider.  

## 2015-01-03 NOTE — Progress Notes (Signed)
Patient ID: Wendy Phillips, female   DOB: [birth year], 34 y.o.   MRN: [id number]  Subjective: 34 year old female presents the office today for follow-up evaluation of right fifth metatarsal possible avulsion fracture. She states that his last appointment she did try to new shoes and since that she has had heel pain. She states that she has had plantar fasciitis before and she feels that this pain feels the same. She has pain in the bottom of her heels which is worse in the morning which is relieved somewhat by ambulation. She previously had steroid injections into the area by her primary care physician does not have one over one year. She denies a specific injury or trauma to the area and denies any change or increase activity to come off of the symptoms. She denies any numbness or tingling to the area. Denies any swelling or any redness over the area. She does continue gets some discomfort the office acid the foot for which she points just proximal to the fifth metatarsal base. She denies a slight redness to the area. She states that she doesn't have much discomfort her regular activity however after prolonged that today she does have some discomfort to the area. No other complaints at this time in no acute changes otherwise his last appointment.  Objective: AAO x3, NAD DP/PT pulses palpable bilaterally, CRT less than 3 seconds Protective sensation intact with Simms Weinstein monofilament, vibratory sensation intact, Achilles tendon reflex intact There is enderness to palpation overlying the plantar medial tubercle of the calcaneus to right heel at the insertion of the plantar fascia. There is no pain along the course of plantar fascial within the arch of the foot. There is no pain with lateral compression of the calcaneus or pain the vibratory sensation. No pain on the posterior aspect of the calcaneus or along the course/insertion of the Achilles tendon. There is mild tenderness on palpation along the  course of the peroneal tendons just proximal to the fifth metatarsal base. There is no specific area of tenderness on the fifth metatarsal this time. There is no overlying edema, erythema, increase in warmth. No other areas of tenderness palpation or pain with vibratory sensation to the foot/ankle. MMT 5/5, ROM WNL No open lesions or pre-ulcerative lesions are identified. No pain with calf compression, swelling, warmth, erythema.  Assessment:  34 year old female with right heel pain, likely plantar fasciitis and resolving pain fifth metatarsal base.  Plan: -X-rays were obtained and reviewed with the patient.  -Treatment options discussed including all alternatives, risks, and complications -Patient elects to proceed with steroid injection into the right heel. Under sterile skin preparation, a total of 2.5cc of kenalog 10, 0.5% Marcaine plain, and 2% lidocaine plain were infiltrated into the symptomatic area without complication. A band-aid was applied. Patient tolerated the injection well without complication. Post-injection care with discussed with the patient. Discussed with the patient to ice the area over the next couple of days to help prevent a steroid flare.  -Dispensed ankle brace top of the heel pain as well as the peroneal tendon pain. -Ice to the area -Discussed stretching exercises -Discussed shoegear modifications/orthotics -Follow-up 3 weeks or sooner if any problems arise. In the meantime, encouraged to call the office with any questions, concerns, change in symptoms.   Celesta Gentile, DPM

## 2015-01-24 ENCOUNTER — Encounter: Payer: Self-pay | Admitting: *Deleted

## 2015-01-24 ENCOUNTER — Ambulatory Visit (INDEPENDENT_AMBULATORY_CARE_PROVIDER_SITE_OTHER): Payer: 59 | Admitting: Podiatry

## 2015-01-24 ENCOUNTER — Encounter: Payer: Self-pay | Admitting: Podiatry

## 2015-01-24 VITALS — BP 119/91 | HR 90 | Resp 18

## 2015-01-24 DIAGNOSIS — T148 Other injury of unspecified body region: Secondary | ICD-10-CM | POA: Diagnosis not present

## 2015-01-24 DIAGNOSIS — M722 Plantar fascial fibromatosis: Secondary | ICD-10-CM | POA: Diagnosis not present

## 2015-01-24 DIAGNOSIS — T148XXA Other injury of unspecified body region, initial encounter: Secondary | ICD-10-CM

## 2015-01-24 DIAGNOSIS — M779 Enthesopathy, unspecified: Secondary | ICD-10-CM | POA: Diagnosis not present

## 2015-01-24 MED ORDER — METHYLPREDNISOLONE 4 MG PO TBPK: ORAL_TABLET | ORAL | Status: DC

## 2015-01-24 NOTE — Progress Notes (Unsigned)
MRI for right foot and ankle with contrast - notifications were required and they are listed below.  Foot - [phone] Ankle - [phone]

## 2015-01-30 NOTE — Progress Notes (Signed)
Patient ID: Wendy Phillips, female   DOB: Jun 23, 1981, 34 y.o.   MRN: [id number]   Subjective: 34 year old female presents the office today for follow-up evaluation of continued heel pain to the right side. She states that she continues have pain in the mornings or after periods of activity and become more withdrawn and pain throughout the day. She says the pain the outside part of her foot on the fifth metatarsal is much improved. She is continuing stretching, icing activities as well as having steroid injections of plantar fasciitis which seems to help temporarily for the pain recurs. She is concerned as the nails may be going on other than plantar fasciitis as she thinks that she should be better by now. Denies any numbness or tingling. Denies any redness or swelling. The pain does not wake her up at night. No other complaints at this time. No acute changes since last appointment.  Objective: AAO x3, NAD DP/PT pulses palpable bilaterally, CRT less than 3 seconds Protective sensation intact with Simms Weinstein monofilament, vibratory sensation intact, Achilles tendon reflex intact There is continued tenderness to palpation overlying the plantar medial tubercle of the calcaneus to right heel at the insertion of the plantar fascia. There is no pain along the course of plantar fascia within the arch of the foot. There is no pain with lateral compression of the calcaneus or pain the vibratory sensation. No pain on the posterior aspect of the calcaneus or along the course/insertion of the Achilles tendon. There is mild tenderness on palpation along the course of the peroneal tendons just proximal to the fifth metatarsal base. There is no specific area of tenderness on the fifth metatarsal this time. There is no overlying edema, erythema, increase in warmth. No other areas of tenderness palpation or pain with vibratory sensation to the foot/ankle. MMT 5/5, ROM WNL No open lesions or pre-ulcerative lesions are  identified. No pain with calf compression, swelling, warmth, erythema.  Assessment:  34 year old female with right heel pain, likely plantar fasciitis  Plan: -X-rays were obtained and reviewed with the patient.  -Treatment options discussed including all alternatives, risks, and complications -As the patient is concerned that maybe something else going on aside from the plantar fasciitis. Discussed MRI with her and she would pursue this. We'll order an MRI to rule out plantar fascial tear, peroneal tendon tear. -Ice to the area -Continue stretching exercises -Discussed shoegear modifications/orthotics -Follow-up after MRI or sooner if any problems arise. In the meantime, encouraged to call the office with any questions, concerns, change in symptoms.   Celesta Gentile, DPM

## 2015-02-02 ENCOUNTER — Ambulatory Visit
Admission: RE | Admit: 2015-02-02 | Discharge: 2015-02-02 | Disposition: A | Discharge status: Home / Self Care | Payer: 59 | Source: Ambulatory Visit | Attending: Podiatry | Admitting: Podiatry

## 2015-02-02 DIAGNOSIS — M722 Plantar fascial fibromatosis: Secondary | ICD-10-CM | POA: Diagnosis not present

## 2015-02-02 DIAGNOSIS — T148XXA Other injury of unspecified body region, initial encounter: Secondary | ICD-10-CM

## 2015-02-02 DIAGNOSIS — M779 Enthesopathy, unspecified: Secondary | ICD-10-CM

## 2015-02-03 ENCOUNTER — Telehealth: Payer: Self-pay | Admitting: Podiatry

## 2015-02-03 ENCOUNTER — Telehealth: Payer: Self-pay | Admitting: *Deleted

## 2015-02-03 NOTE — Telephone Encounter (Signed)
Patient called wanting to get her MRI results so she can see about coming in to see Dr. Milinda Pointer one day next week.

## 2015-02-03 NOTE — Telephone Encounter (Signed)
"  Calling to see if I can get my MRI results."

## 2015-02-06 ENCOUNTER — Telehealth: Payer: Self-pay | Admitting: *Deleted

## 2015-02-06 NOTE — Telephone Encounter (Signed)
Called patient-let her know that Dr. Jacqualyn Posey has not reviewed results yet and we will call her once he does.

## 2015-02-06 NOTE — Telephone Encounter (Addendum)
Pt request MRI results.  Pt called states this is a 2nd request for MRI results, I informed pt I would call as soon as Dr. Jacqualyn Posey had reviewed.

## 2015-02-07 ENCOUNTER — Emergency Department
Admission: EM | Admit: 2015-02-07 | Discharge: 2015-02-07 | Disposition: A | Discharge status: Home / Self Care | Payer: 59 | Attending: Emergency Medicine | Admitting: Emergency Medicine

## 2015-02-07 ENCOUNTER — Telehealth: Payer: Self-pay | Admitting: *Deleted

## 2015-02-07 DIAGNOSIS — G43009 Migraine without aura, not intractable, without status migrainosus: Secondary | ICD-10-CM | POA: Diagnosis not present

## 2015-02-07 DIAGNOSIS — M722 Plantar fascial fibromatosis: Secondary | ICD-10-CM

## 2015-02-07 DIAGNOSIS — R51 Headache: Secondary | ICD-10-CM | POA: Diagnosis present

## 2015-02-07 LAB — CBC WITH DIFFERENTIAL/PLATELET
Basophils Absolute: 0 10*3/uL (ref 0–0.1)
Basophils Relative: 0 %
EOS ABS: 0.1 10*3/uL (ref 0–0.7)
Eosinophils Relative: 1 %
HCT: 35.3 % (ref 35.0–47.0)
Hemoglobin: 12 g/dL (ref 12.0–16.0)
Lymphocytes Relative: 25 %
Lymphs Abs: 1.7 10*3/uL (ref 1.0–3.6)
MCH: 29.4 pg (ref 26.0–34.0)
MCHC: 33.9 g/dL (ref 32.0–36.0)
MCV: 86.8 fL (ref 80.0–100.0)
MONOS PCT: 6 %
Monocytes Absolute: 0.4 10*3/uL (ref 0.2–0.9)
NEUTROS ABS: 4.7 10*3/uL (ref 1.4–6.5)
Neutrophils Relative %: 68 %
Platelets: 153 10*3/uL (ref 150–440)
RBC: 4.07 MIL/uL (ref 3.80–5.20)
RDW: 13.6 % (ref 11.5–14.5)
WBC: 6.9 10*3/uL (ref 3.6–11.0)

## 2015-02-07 LAB — COMPREHENSIVE METABOLIC PANEL
ALBUMIN: 3.9 g/dL (ref 3.5–5.0)
ALT: 32 U/L (ref 14–54)
AST: 25 U/L (ref 15–41)
Alkaline Phosphatase: 82 U/L (ref 38–126)
Anion gap: 6 (ref 5–15)
BILIRUBIN TOTAL: 0.3 mg/dL (ref 0.3–1.2)
BUN: 14 mg/dL (ref 6–20)
CALCIUM: 9 mg/dL (ref 8.9–10.3)
CO2: 27 mmol/L (ref 22–32)
Chloride: 106 mmol/L (ref 101–111)
Creatinine, Ser: 0.53 mg/dL (ref 0.44–1.00)
GFR calc Af Amer: >60 mL/min (ref >60)
GFR calc non Af Amer: >60 mL/min (ref >60)
GLUCOSE: 107 mg/dL — AB (ref 65–99)
Potassium: 3.7 mmol/L (ref 3.5–5.1)
Sodium: 139 mmol/L (ref 135–145)
Total Protein: 7.1 g/dL (ref 6.5–8.1)

## 2015-02-07 MED ORDER — METOCLOPRAMIDE HCL 5 MG/ML IJ SOLN
10.0000 mg | Freq: Once | INTRAMUSCULAR | Status: AC
  Administered 2015-02-07: 10 mg via INTRAVENOUS
  Filled 2015-02-07: qty 2

## 2015-02-07 MED ORDER — KETOROLAC TROMETHAMINE 30 MG/ML IJ SOLN
30.0000 mg | Freq: Once | INTRAMUSCULAR | Status: AC
  Administered 2015-02-07: 30 mg via INTRAVENOUS
  Filled 2015-02-07: qty 1

## 2015-02-07 MED ORDER — KETOROLAC TROMETHAMINE 10 MG PO TABS: 10.0000 mg | ORAL_TABLET | Freq: Four times a day (QID) | ORAL | Status: DC | PRN

## 2015-02-07 MED ORDER — METOCLOPRAMIDE HCL 10 MG PO TABS: 10.0000 mg | ORAL_TABLET | Freq: Three times a day (TID) | ORAL | Status: DC

## 2015-02-07 MED ORDER — DIPHENHYDRAMINE HCL 50 MG/ML IJ SOLN
12.5000 mg | Freq: Once | INTRAMUSCULAR | Status: AC
  Administered 2015-02-07: 12.5 mg via INTRAVENOUS
  Filled 2015-02-07: qty 1

## 2015-02-07 NOTE — ED Notes (Signed)
Has a hx of migraines. States it has been a year since having one. Used maxalt in past. States using OTC without relief.

## 2015-02-07 NOTE — ED Provider Notes (Signed)
[facility] Emergency Department Provider Note ____________________________________________  Time seen: Approximately 3:51 PM  I have reviewed the triage vital signs and the nursing notes.   HISTORY  Chief Complaint Migraine  HPI Wendy Phillips is a 34 y.o. female who presents to the emergency department for evaluation of a migraine headache that started on Friday. She has taken ibuprofen without relief. She states that she has a history of migraine headaches however this one is different in severity and length.  Location: Frontal Similar to previous headaches: Yes Duration: 4 days TIMING: Unable to pinpoint onset SEVERITY: 7 QUALITY: Throbbing CONTEXT: No known exposures MODIFYING FACTORS: No relief with ibuprofen ASSOCIATED SYMPTOMS: Nausea and diarrhea History reviewed. No pertinent past medical history.  There are no active problems to display for this patient.   Past Surgical History  Procedure Laterality Date  . Abdominal hysterectomy      Current Outpatient Rx  Name  Route  Sig  Dispense  Refill  . ibuprofen (ADVIL,MOTRIN) 600 MG tablet               . ketorolac (TORADOL) 10 MG tablet   Oral   Take 1 tablet (10 mg total) by mouth every 6 (six) hours as needed.   20 tablet   0   . methylPREDNISolone (MEDROL DOSEPAK) 4 MG TBPK tablet      Take as directed   21 tablet   0   . metoCLOPramide (REGLAN) 10 MG tablet   Oral   Take 1 tablet (10 mg total) by mouth 3 (three) times daily with meals.   15 tablet   0     Allergies Review of patient's allergies indicates no known allergies.  No family history on file.  Social History History  Substance Use Topics  . Smoking status: Never Smoker   . Smokeless tobacco: Never Used  . Alcohol Use: Not on file    Review of Systems Constitutional: No fever/chills Eyes: No visual changes. ENT: No sore throat. Cardiovascular: Denies chest pain. Respiratory: Denies shortness of  breath. Gastrointestinal: No abdominal pain.  No nausea, no vomiting. Positive for diarrhea.  No constipation. Genitourinary: Negative for dysuria or incontinence. Musculoskeletal: Negative for pain. Skin: Negative for rash. Neurological: Negative for headaches, focal weakness or numbness. No confusion or fainting. Psychiatric:No anxiety or depression  10-point ROS otherwise negative.  ____________________________________________   PHYSICAL EXAM:  VITAL SIGNS: ED Triage Vitals  Enc Vitals Group     BP 02/07/15 1403 147/90 mmHg     Pulse Rate 02/07/15 1403 86     Resp 02/07/15 1403 18     Temp 02/07/15 1403 97.7 F (36.5 C)     Temp Source 02/07/15 1403 Oral     SpO2 02/07/15 1403 100 %     Weight 02/07/15 1403 187 lb (84.823 kg)     Height 02/07/15 1403 5\' 1"  (1.549 m)     Head Cir --      Peak Flow --      Pain Score 02/07/15 1413 6     Pain Loc --      Pain Edu? --      Excl. in [place]? --     Constitutional: Alert and oriented. Appears uncomfortable but in no acute distress. Eyes: Conjunctivae are normal. PERRL. EOMI. Head: Atraumatic. Nose: No congestion/rhinnorhea. Mouth/Throat: Mucous membranes are moist.  Oropharynx non-erythematous. Neck: No stridor.  No cervical lymphadenopathy. Cardiovascular: Normal rate, regular rhythm. Grossly normal heart sounds.  Good peripheral  circulation. Respiratory: Normal respiratory effort.  No retractions. Lungs CTAB. Gastrointestinal: Soft and nontender. No distention. No abdominal bruits. No CVA tenderness. Musculoskeletal: No lower extremity tenderness nor edema.  No joint effusions. Neurologic:  Normal speech and language. No gross focal neurologic deficits are appreciated. No gait instability.  Cranial nerves: 2-10 normal as tested.  Cerebellar: Normal Romberg, finger-nose-finger, normal gait. Sensorimotor: No aphasia, pronator drift, clonus, sensory loss or abnormal reflexes.  Skin:  Skin is warm, dry and intact. No rash  noted. Psychiatric: Mood and affect are normal. Speech and behavior are normal. Normal thought process and cognition.  ____________________________________________   LABS (all labs ordered are listed, but only abnormal results are displayed)  Labs Reviewed  CBC WITH DIFFERENTIAL/PLATELET  COMPREHENSIVE METABOLIC PANEL   ____________________________________________  EKG   ____________________________________________  RADIOLOGY  Not indicated ____________________________________________   PROCEDURES  Procedure(s) performed: None  Critical Care performed: No  ____________________________________________   INITIAL IMPRESSION / ASSESSMENT AND PLAN / ED COURSE  Pertinent labs & imaging results that were available during my care of the patient were reviewed by me and considered in my medical decision making (see chart for details).  Patient received Toradol, Reglan, and Benadryl while in the emergency department today. She had near relief of headache. She requested discharge. She will be given a prescription for the Toradol and Reglan.  Patient was advised to follow up with the primary care provider for symptoms that are not relieved or improved over the next 24 hours. Also advised to return to the emergency department for symptoms that change or worsen if unable to schedule an appointment. ____________________________________________   FINAL CLINICAL IMPRESSION(S) / ED DIAGNOSES  Migraine   Victorino Dike, FNP 02/07/15 [place], FNP 02/07/15 1655  Harvest Dark, MD 02/07/15 2230

## 2015-02-07 NOTE — Discharge Instructions (Signed)

## 2015-02-07 NOTE — Telephone Encounter (Signed)
Spoke to patient regarding her mri results, results showed mild plantar fasciitis, Dr Jacqualyn Posey is suggesting physical therapy.  Wendy Phillips is willing to do therapy , will write prescription for stewart pt. Patient is aware

## 2015-02-07 NOTE — ED Notes (Signed)
Pt came to ED w c/o migraine, n/v/d, lightheadedness.  Pt reports no relief w/ ibuprofen.

## 2015-02-07 NOTE — Telephone Encounter (Signed)
We will call her with the results today. And I always tell them to go ahead and make an appointment after they get the MRI.

## 2015-03-01 DIAGNOSIS — K219 Gastro-esophageal reflux disease without esophagitis: Secondary | ICD-10-CM | POA: Insufficient documentation

## 2015-03-29 DIAGNOSIS — R0683 Snoring: Secondary | ICD-10-CM | POA: Insufficient documentation

## 2015-06-19 ENCOUNTER — Other Ambulatory Visit: Payer: Self-pay | Admitting: Family Medicine

## 2015-06-19 DIAGNOSIS — N644 Mastodynia: Secondary | ICD-10-CM

## 2015-06-20 ENCOUNTER — Other Ambulatory Visit: Payer: Self-pay | Admitting: Family Medicine

## 2015-06-20 DIAGNOSIS — N644 Mastodynia: Secondary | ICD-10-CM

## 2015-06-30 ENCOUNTER — Ambulatory Visit: Payer: 59

## 2015-06-30 ENCOUNTER — Other Ambulatory Visit: Payer: 59

## 2015-09-13 ENCOUNTER — Emergency Department
Admission: EM | Admit: 2015-09-13 | Discharge: 2015-09-13 | Disposition: A | Discharge status: Home / Self Care | Payer: 59 | Attending: Emergency Medicine | Admitting: Emergency Medicine

## 2015-09-13 ENCOUNTER — Emergency Department: Payer: 59

## 2015-09-13 DIAGNOSIS — I10 Essential (primary) hypertension: Secondary | ICD-10-CM | POA: Insufficient documentation

## 2015-09-13 DIAGNOSIS — R0602 Shortness of breath: Secondary | ICD-10-CM

## 2015-09-13 DIAGNOSIS — R079 Chest pain, unspecified: Secondary | ICD-10-CM | POA: Diagnosis present

## 2015-09-13 DIAGNOSIS — Z7952 Long term (current) use of systemic steroids: Secondary | ICD-10-CM | POA: Insufficient documentation

## 2015-09-13 DIAGNOSIS — Z79899 Other long term (current) drug therapy: Secondary | ICD-10-CM | POA: Diagnosis not present

## 2015-09-13 LAB — CBC WITH DIFFERENTIAL/PLATELET
Basophils Absolute: 0 10*3/uL (ref 0–0.1)
Basophils Relative: 0 %
EOS ABS: 0.1 10*3/uL (ref 0–0.7)
Eosinophils Relative: 2 %
HEMATOCRIT: 35 % (ref 35.0–47.0)
HEMOGLOBIN: 11.7 g/dL — AB (ref 12.0–16.0)
LYMPHS ABS: 1.5 10*3/uL (ref 1.0–3.6)
Lymphocytes Relative: 28 %
MCH: 29.4 pg (ref 26.0–34.0)
MCHC: 33.5 g/dL (ref 32.0–36.0)
MCV: 87.6 fL (ref 80.0–100.0)
Monocytes Absolute: 0.5 10*3/uL (ref 0.2–0.9)
Monocytes Relative: 10 %
NEUTROS ABS: 3.1 10*3/uL (ref 1.4–6.5)
NEUTROS PCT: 60 %
Platelets: 173 10*3/uL (ref 150–440)
RBC: 3.99 MIL/uL (ref 3.80–5.20)
RDW: 12.9 % (ref 11.5–14.5)
WBC: 5.1 10*3/uL (ref 3.6–11.0)

## 2015-09-13 LAB — BASIC METABOLIC PANEL
Anion gap: 8 (ref 5–15)
BUN: 13 mg/dL (ref 6–20)
CHLORIDE: 105 mmol/L (ref 101–111)
CO2: 25 mmol/L (ref 22–32)
CREATININE: 0.6 mg/dL (ref 0.44–1.00)
Calcium: 8.9 mg/dL (ref 8.9–10.3)
GFR calc non Af Amer: >60 mL/min (ref >60)
Glucose, Bld: 97 mg/dL (ref 65–99)
POTASSIUM: 3.8 mmol/L (ref 3.5–5.1)
Sodium: 138 mmol/L (ref 135–145)

## 2015-09-13 LAB — TROPONIN I

## 2015-09-13 MED ORDER — ACETAMINOPHEN 500 MG PO TABS
1000.0000 mg | ORAL_TABLET | Freq: Once | ORAL | Status: AC
  Administered 2015-09-13: 1000 mg via ORAL
  Filled 2015-09-13: qty 2

## 2015-09-13 MED ORDER — OMEPRAZOLE 40 MG PO CPDR: 40.0000 mg | DELAYED_RELEASE_CAPSULE | Freq: Every day | ORAL | Status: DC

## 2015-09-13 MED ORDER — GI COCKTAIL ~~LOC~~
30.0000 mL | Freq: Once | ORAL | Status: AC
  Administered 2015-09-13: 30 mL via ORAL
  Filled 2015-09-13 (×2): qty 30

## 2015-09-13 NOTE — ED Notes (Signed)
Pt reports mid sternal CP radiating under the right breast starting at 5 pm. Pt reports she also has a headache

## 2015-09-13 NOTE — Discharge Instructions (Signed)
° °  Return to the emergency department if he developed chest pain, shortness of breath, palpitations, lightheadedness or fainting, or any other symptoms concerning to you.

## 2015-09-13 NOTE — ED Provider Notes (Signed)
[facility] Emergency Department Provider Note  ____________________________________________  Time seen: Approximately 8:57 PM  I have reviewed the triage vital signs and the nursing notes.   HISTORY  Chief Complaint Chest Pain    HPI Wendy Phillips is a 35 y.o. female , otherwise healthy, presenting with central chest pain. Patient reports that she was preparing dinner when she developed a pain from her neck to her sternum. She felt like if she walked around she was short of breath but denies any associated radiation, pain, nausea or vomiting or palpitations. No lightheadedness or syncope. She has also developed a headache and ear fullness sensation but has not recently had any cough, congestion or rhinorrhea, sore throat, fever or chills.  FH: father MI at age 62 SH: denies tobacco, cocaine   No past medical history on file.  There are no active problems to display for this patient.   Past Surgical History  Procedure Laterality Date  . Abdominal hysterectomy      Current Outpatient Rx  Name  Route  Sig  Dispense  Refill  . ibuprofen (ADVIL,MOTRIN) 600 MG tablet               . ketorolac (TORADOL) 10 MG tablet   Oral   Take 1 tablet (10 mg total) by mouth every 6 (six) hours as needed.   20 tablet   0   . methylPREDNISolone (MEDROL DOSEPAK) 4 MG TBPK tablet      Take as directed   21 tablet   0   . metoCLOPramide (REGLAN) 10 MG tablet   Oral   Take 1 tablet (10 mg total) by mouth 3 (three) times daily with meals.   15 tablet   0   . omeprazole (PRILOSEC) 40 MG capsule   Oral   Take 1 capsule (40 mg total) by mouth daily.   30 capsule   0     Allergies Review of patient's allergies indicates no known allergies.  No family history on file.  Social History Social History  Substance Use Topics  . Smoking status: Never Smoker   . Smokeless tobacco: Never Used  . Alcohol Use: Not on file    Review of  Systems Constitutional: No fever/chills. No lightheadedness or syncope. Eyes: No visual changes. No blurred or double vision. ENT: No sore throat. Cardiovascular: Positive central chest pain, without palpitations. Respiratory: Positive exertional shortness of breath.  No cough. Gastrointestinal: No abdominal pain.  No nausea, no vomiting.  No diarrhea.  No constipation. Genitourinary: Negative for dysuria. Musculoskeletal: Negative for back pain. No lower extremity swelling. Skin: Negative for rash. Neurological: Negative for headaches, focal weakness or numbness.  10-point ROS otherwise negative.  ____________________________________________   PHYSICAL EXAM:  VITAL SIGNS: ED Triage Vitals  Enc Vitals Group     BP 09/13/15 1947 143/97 mmHg     Pulse Rate 09/13/15 1947 99     Resp 09/13/15 1947 18     Temp 09/13/15 1947 98.2 F (36.8 C)     Temp src --      SpO2 09/13/15 1947 97 %     Weight --      Height --      Head Cir --      Peak Flow --      Pain Score 09/13/15 1941 5     Pain Loc --      Pain Edu? --      Excl. in [place]? --  Constitutional: Alert and oriented. Well appearing and in no acute distress. Answer question appropriately. Eyes: Conjunctivae are normal.  EOMI. no scleral icterus. EARS no fullness, fluid or erythema on the bilateral tympanic membranes. Canals are clear as well. Head: Atraumatic. Nose: No congestion/rhinnorhea. Mouth/Throat: Mucous membranes are moist. No posterior pharyngeal erythema, tonsillar swelling or exudate. Neck: No stridor.  Supple.  No JVD. Cardiovascular: Normal rate, regular rhythm. No murmurs, rubs or gallops.  Respiratory: Normal respiratory effort.  No retractions. Lungs CTAB.  No wheezes, rales or ronchi. Gastrointestinal: Soft and nondistended. Positive tenderness to palpation in the epigastrium without rebound or guarding. No peritoneal signs.  Musculoskeletal: No LE edema. No palpable cords or tenderness to palpation  in the calves. Negative Homans sign. Neurologic:  Normal speech and language. No gross focal neurologic deficits are appreciated.  Skin:  Skin is warm, dry and intact. No rash noted. Psychiatric: Mood and affect are normal. Speech and behavior are normal.  Normal judgement  ____________________________________________   LABS (all labs ordered are listed, but only abnormal results are displayed)  Labs Reviewed  CBC WITH DIFFERENTIAL/PLATELET - Abnormal; Notable for the following:    Hemoglobin 11.7 (*)    All other components within normal limits  BASIC METABOLIC PANEL  TROPONIN I   ____________________________________________  EKG  ED ECG REPORT I, Eula Listen, the attending physician, personally viewed and interpreted this ECG.   Date: 09/13/2015  EKG Time: 1945  Rate: 76  Rhythm: normal sinus rhythm  Axis: Normal  Intervals:none  ST&T Change: No ST elevation. Nonspecific T-wave inversions in V1 through V3.  ____________________________________________  RADIOLOGY  Dg Chest 2 View  09/13/2015  CLINICAL DATA:  Midsternal chest pain starting 4 hours prior. EXAM: CHEST  2 VIEW COMPARISON:  None. FINDINGS: The cardiomediastinal contours are normal. The lungs are clear. Pulmonary vasculature is normal. No consolidation, pleural effusion, or pneumothorax. No acute osseous abnormalities are seen. IMPRESSION: No acute pulmonary process. Electronically Signed   By: Jeb Levering M.D.   On: 09/13/2015 21:03    ____________________________________________   PROCEDURES  Procedure(s) performed: None  Critical Care performed: No ____________________________________________   INITIAL IMPRESSION / ASSESSMENT AND PLAN / ED COURSE  Pertinent labs & imaging results that were available during my care of the patient were reviewed by me and considered in my medical decision making (see chart for details).  36 y.o. female with central chest pain, found to have epigastric  pain on exam. The patient is mildly hypertensive, but otherwise has stable vital signs. Her EKG does not show any ischemic changes, and her troponin is negative. She has a chest x-ray which does not show any acute cardiopulmonary process. After GI cocktail, the patient's symptoms are improving. Plan to discharge her home and have her follow-up with her primary care physician.  ____________________________________________  FINAL CLINICAL IMPRESSION(S) / ED DIAGNOSES  Final diagnoses:  Chest pain, unspecified chest pain type  Exertional shortness of breath      NEW MEDICATIONS STARTED DURING THIS VISIT:  New Prescriptions   OMEPRAZOLE (PRILOSEC) 40 MG CAPSULE    Take 1 capsule (40 mg total) by mouth daily.     Eula Listen, MD 09/13/15 2135

## 2016-05-17 ENCOUNTER — Other Ambulatory Visit: Payer: Self-pay | Admitting: Gastroenterology

## 2016-05-17 DIAGNOSIS — R945 Abnormal results of liver function studies: Secondary | ICD-10-CM

## 2016-05-17 DIAGNOSIS — R7989 Other specified abnormal findings of blood chemistry: Secondary | ICD-10-CM

## 2016-05-17 DIAGNOSIS — R1011 Right upper quadrant pain: Secondary | ICD-10-CM

## 2016-05-24 ENCOUNTER — Ambulatory Visit
Admission: RE | Admit: 2016-05-24 | Discharge: 2016-05-24 | Disposition: A | Discharge status: Home / Self Care | Payer: 59 | Source: Ambulatory Visit | Attending: Gastroenterology | Admitting: Gastroenterology

## 2016-05-24 DIAGNOSIS — R1011 Right upper quadrant pain: Secondary | ICD-10-CM

## 2016-05-24 DIAGNOSIS — R7989 Other specified abnormal findings of blood chemistry: Secondary | ICD-10-CM | POA: Diagnosis present

## 2016-05-24 DIAGNOSIS — R945 Abnormal results of liver function studies: Secondary | ICD-10-CM

## 2016-09-24 ENCOUNTER — Encounter: Payer: Self-pay | Admitting: Podiatry

## 2016-09-24 ENCOUNTER — Ambulatory Visit (INDEPENDENT_AMBULATORY_CARE_PROVIDER_SITE_OTHER): Payer: 59 | Admitting: Podiatry

## 2016-09-24 DIAGNOSIS — M79673 Pain in unspecified foot: Secondary | ICD-10-CM | POA: Diagnosis not present

## 2016-09-24 DIAGNOSIS — M79671 Pain in right foot: Secondary | ICD-10-CM

## 2016-09-24 DIAGNOSIS — M79672 Pain in left foot: Secondary | ICD-10-CM

## 2016-09-24 DIAGNOSIS — M722 Plantar fascial fibromatosis: Secondary | ICD-10-CM | POA: Diagnosis not present

## 2016-09-24 MED ORDER — MELOXICAM 15 MG PO TABS: 15.0000 mg | ORAL_TABLET | Freq: Every day | ORAL | 2 refills | Status: DC

## 2016-10-02 MED ORDER — BETAMETHASONE SOD PHOS & ACET 6 (3-3) MG/ML IJ SUSP: 3.0000 mg | Freq: Once | INTRAMUSCULAR | Status: DC

## 2016-10-02 NOTE — Progress Notes (Signed)
   Subjective: Patient presents today for pain and tenderness in the feet bilaterally. Patient states the foot pain has been hurting for several weeks now. Patient states that it hurts in the mornings with the first steps out of bed. Patient presents today for further treatment and evaluation  Objective: Physical Exam General: The patient is alert and oriented x3 in no acute distress.  Dermatology: Skin is warm, dry and supple bilateral lower extremities. Negative for open lesions or macerations bilateral.   Vascular: Dorsalis Pedis and Posterior Tibial pulses palpable bilateral.  Capillary fill time is immediate to all digits.  Neurological: Epicritic and protective threshold intact bilateral.   Musculoskeletal: Tenderness to palpation at the medial calcaneal tubercale and through the insertion of the plantar fascia of the bilateral feet. All other joints range of motion within normal limits bilateral. Strength 5/5 in all groups bilateral.   Radiographic exam: Normal osseous mineralization. Joint spaces preserved. No fracture/dislocation/boney destruction. Calcaneal spur present with mild thickening of plantar fascia bilateral. No other soft tissue abnormalities or radiopaque foreign bodies.   Assessment: #1 plantar fasciitis bilateral feet #2 pain in bilateral feet  Plan of Care:  1. Patient evaluated. Xrays reviewed.   2. Injection of 0.5cc Celestone soluspan injected into the bilateral heels.  3. Instructed patient regarding therapies and modalities at home to alleviate symptoms.  4. Rx for Meloxicam 15mg  PO given to patient.  5. Plantar fascial band(s) dispensed for bilateral plantar fasciitis. 6. Return to clinic in 4 weeks.    Edrick Kins, DPM [facility]  Dr. Edrick Kins, [address]                                        [place], [place] [address]                Office [phone]  Fax [phone]

## 2016-11-01 ENCOUNTER — Ambulatory Visit: Payer: 59 | Admitting: Podiatry

## 2016-11-12 ENCOUNTER — Ambulatory Visit (INDEPENDENT_AMBULATORY_CARE_PROVIDER_SITE_OTHER): Payer: 59 | Admitting: Podiatry

## 2016-11-12 DIAGNOSIS — M659 Synovitis and tenosynovitis, unspecified: Secondary | ICD-10-CM | POA: Diagnosis not present

## 2016-11-12 DIAGNOSIS — M722 Plantar fascial fibromatosis: Secondary | ICD-10-CM | POA: Diagnosis not present

## 2016-11-14 NOTE — Progress Notes (Signed)
   Subjective: Patient presents today for follow up evaluation of bilateral plantar fasciitis. She states she is experiencing worsening pain in the left foot. She states the injection helped for about two days but then returned. She states the brace provides no relief. She denies any complaints of the right foot. Patient presents today for further treatment and evaluation  Objective: Physical Exam General: The patient is alert and oriented x3 in no acute distress.  Dermatology: Skin is warm, dry and supple bilateral lower extremities. Negative for open lesions or macerations bilateral.   Vascular: Dorsalis Pedis and Posterior Tibial pulses palpable bilateral.  Capillary fill time is immediate to all digits.  Neurological: Epicritic and protective threshold intact bilateral.   Musculoskeletal: Tenderness to palpation at the medial calcaneal tubercale and through the insertion of the plantar fascia of the left foot. Tenderness to palpation of the left ankle joint. All other joints range of motion within normal limits bilateral. Strength 5/5 in all groups bilateral.    Assessment: #1 plantar fasciitis left-lateral band #2 pain in left ankle  Plan of Care:  1. Patient evaluated. Xrays reviewed.   2. Injection of 0.5cc Celestone soluspan injected into the left heel.  3. Injection of 0.5cc Celestone soluspan injected into the left ankle. 4. Instructed patient regarding therapies and modalities at home to alleviate symptoms.  5. Continue wearing good shoe gear, OTC insoles. 6. Discontinue wearing plantar fasciitis braces. 7. Return to clinic in 4 weeks  Works at [employer], DPM [facility]  Dr. Edrick Kins, [address]                                        [place], [place] [address]                Office [phone]  Fax [phone]

## 2016-11-21 MED ORDER — BETAMETHASONE SOD PHOS & ACET 6 (3-3) MG/ML IJ SUSP: 3.0000 mg | Freq: Once | INTRAMUSCULAR | Status: DC

## 2016-12-31 ENCOUNTER — Ambulatory Visit: Payer: 59 | Admitting: Podiatry

## 2017-05-17 IMAGING — US US ABDOMEN LIMITED
1 series · 14 of 25 positions shown · non-contrast
Comparison: None.

CLINICAL DATA: Abnormal liver function tests. RIGHT upper quadrant
pain.

EXAM:
US ABDOMEN LIMITED - RIGHT UPPER QUADRANT

[Series 1: us abdomen limited · 0.22mm/px · 14 of 39 slices shown]
[im 1/39]
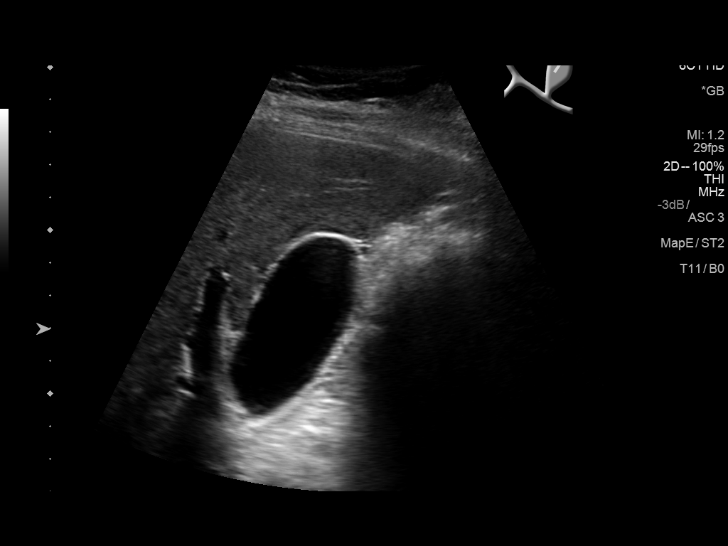
[im 4/39]
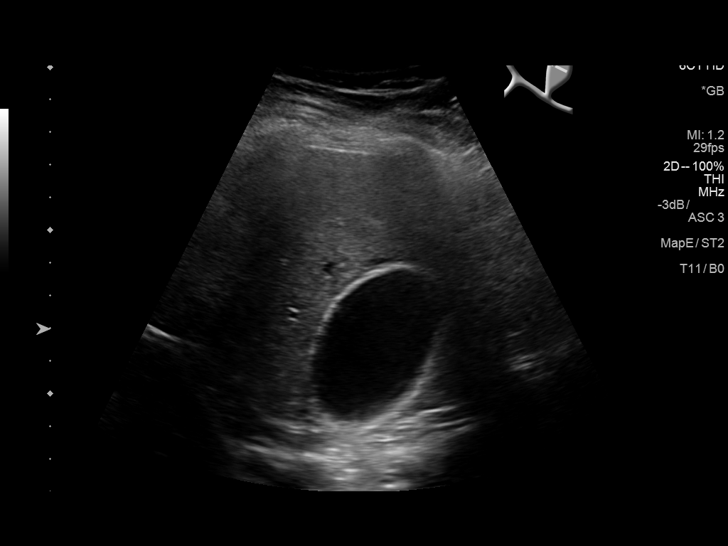
[im 7/39]
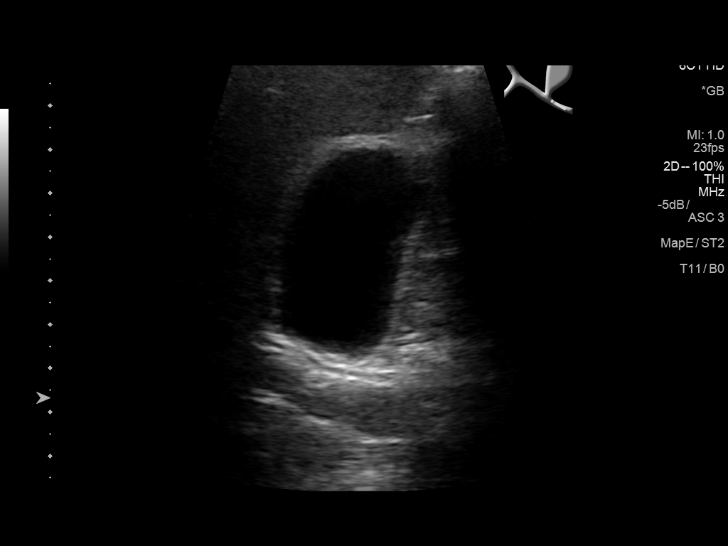
[im 10/39]
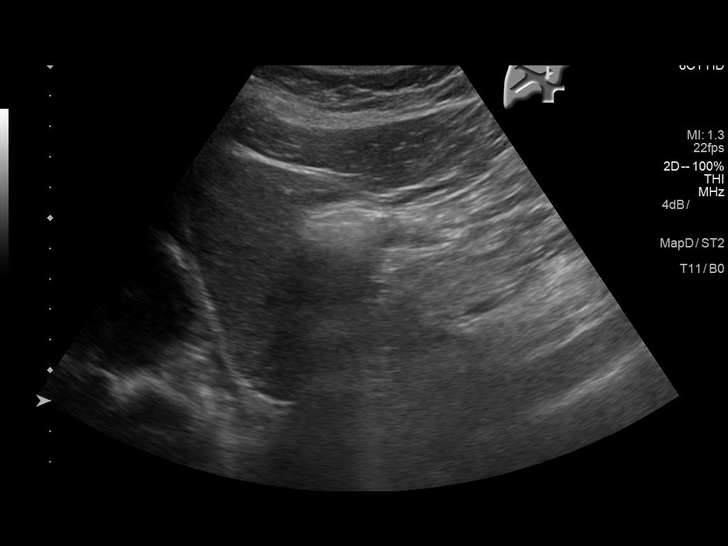
[im 13/39]
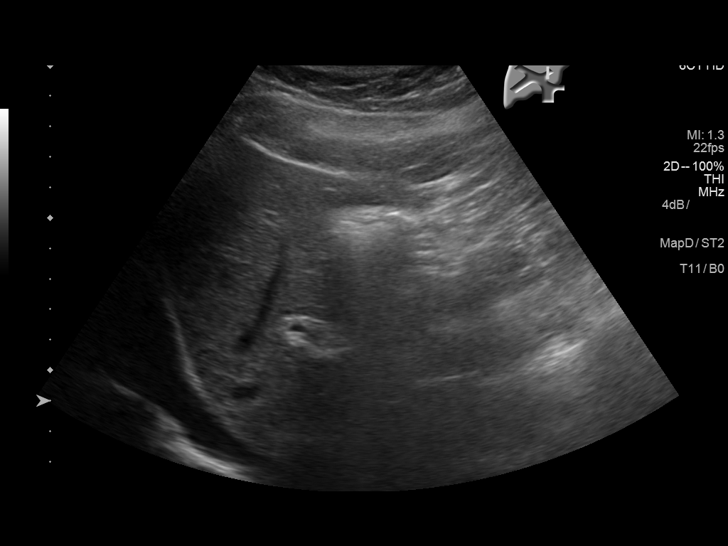
[im 15/39]
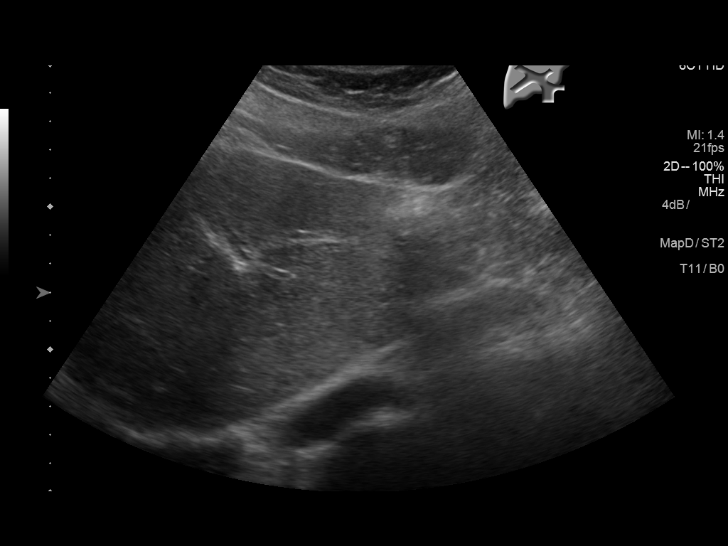
[im 18/39]
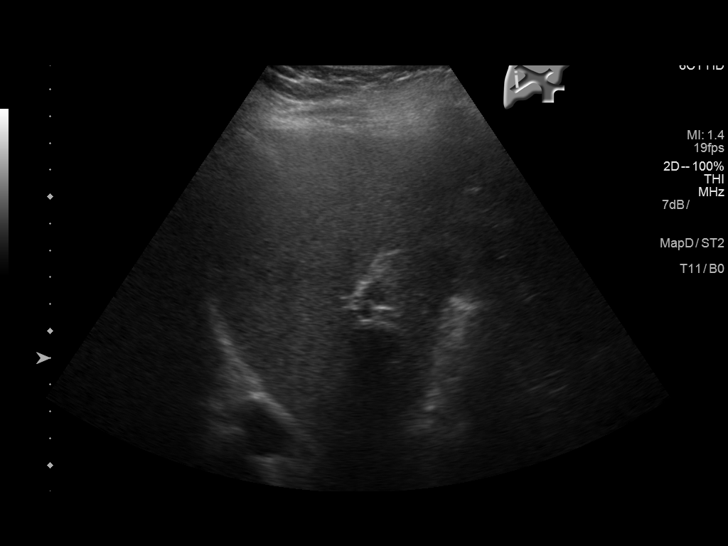
[im 21/39]
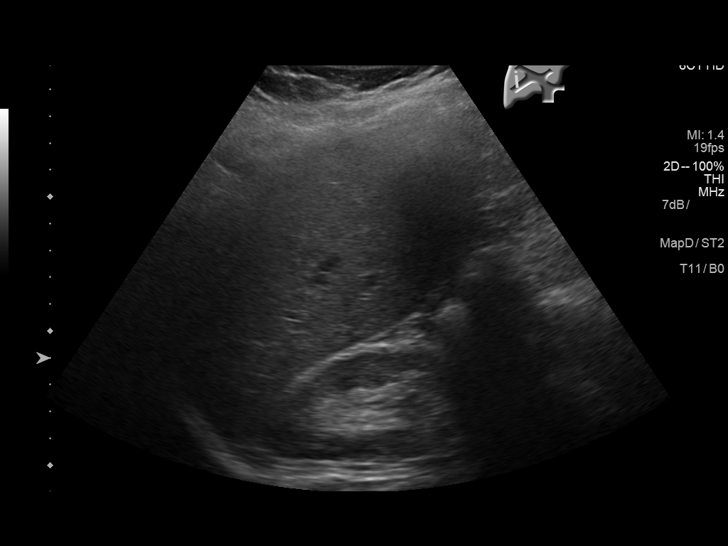
[im 24/39]
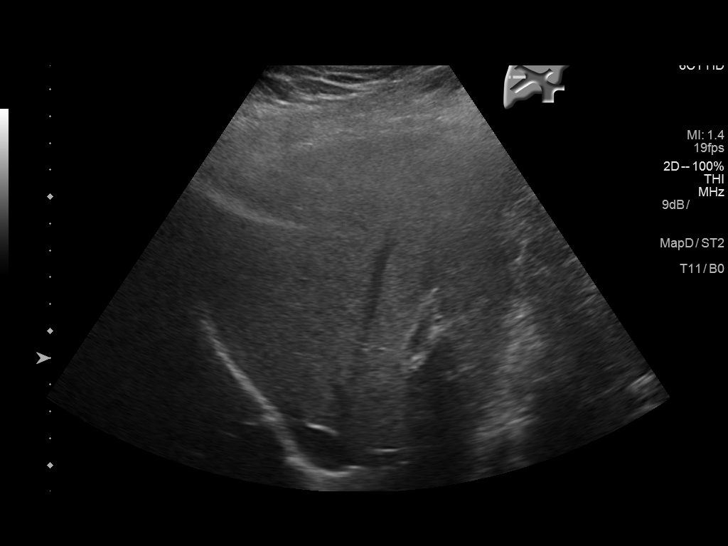
[im 26/39]
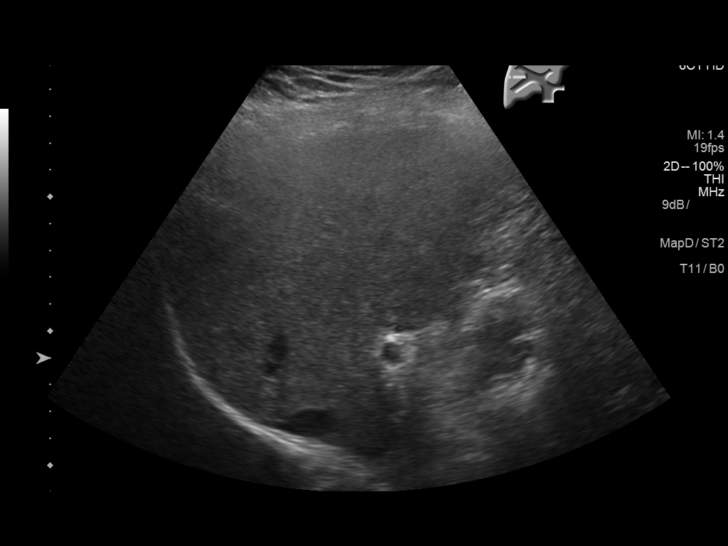
[im 29/39]
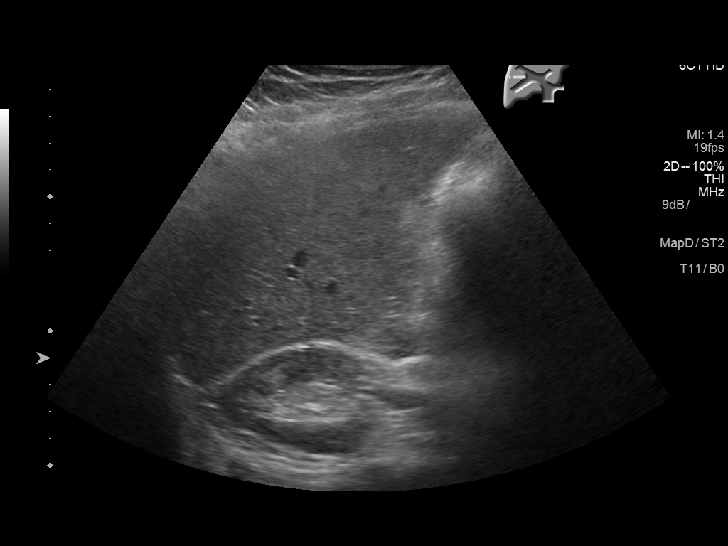
[im 32/39]
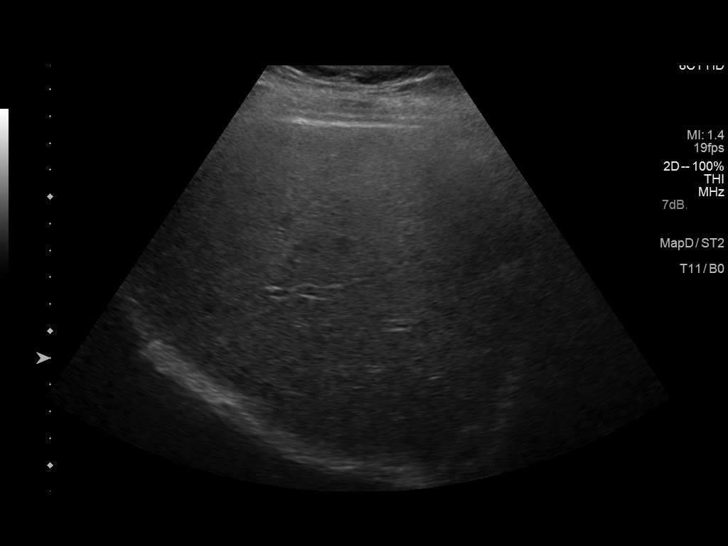
[im 35/39]
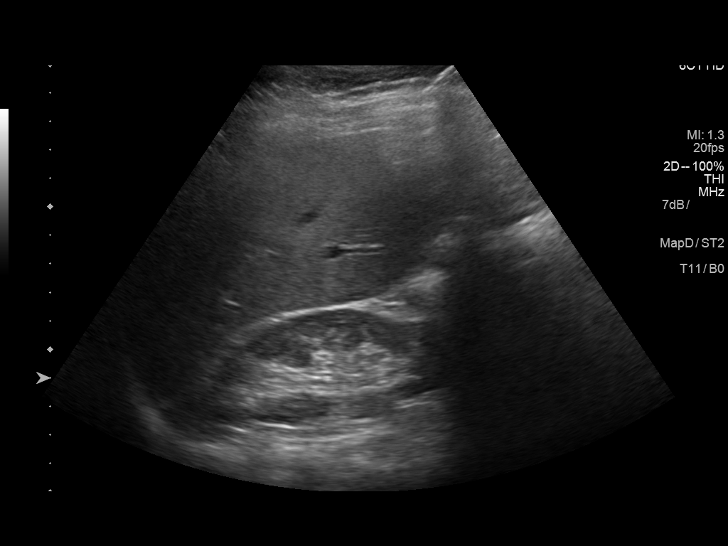
[im 39/39]
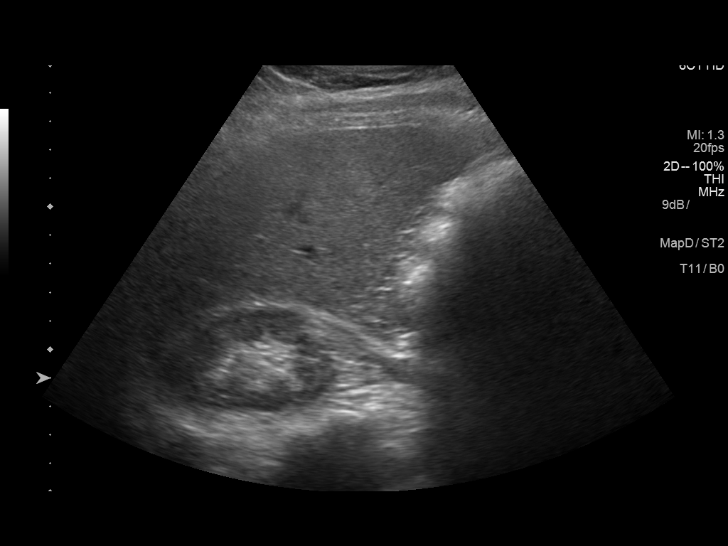

[14 of 25 positions shown; findings below may reference images not displayed]

FINDINGS: Gallbladder:

No gallstones or wall thickening visualized. No sonographic Murphy
sign noted by sonographer.

Common bile duct:

Diameter: Normal, 2.0 mm

Liver:

No focal lesion identified. Within normal limits in parenchymal
echogenicity.
IMPRESSION: Negative exam.

## 2017-07-24 ENCOUNTER — Other Ambulatory Visit: Payer: Self-pay | Admitting: Obstetrics and Gynecology

## 2017-07-24 DIAGNOSIS — Z1231 Encounter for screening mammogram for malignant neoplasm of breast: Secondary | ICD-10-CM

## 2017-08-13 ENCOUNTER — Ambulatory Visit
Admission: RE | Admit: 2017-08-13 | Discharge: 2017-08-13 | Disposition: A | Discharge status: Home / Self Care | Payer: Managed Care, Other (non HMO) | Source: Ambulatory Visit | Attending: Obstetrics and Gynecology | Admitting: Obstetrics and Gynecology

## 2017-08-13 DIAGNOSIS — Z1231 Encounter for screening mammogram for malignant neoplasm of breast: Secondary | ICD-10-CM | POA: Insufficient documentation

## 2019-07-09 HISTORY — PX: GASTRIC BYPASS: SHX52

## 2019-08-09 DIAGNOSIS — D239 Other benign neoplasm of skin, unspecified: Secondary | ICD-10-CM

## 2019-08-09 HISTORY — DX: Other benign neoplasm of skin, unspecified: D23.9

## 2019-11-29 ENCOUNTER — Ambulatory Visit: Payer: Managed Care, Other (non HMO) | Admitting: Dermatology

## 2019-11-29 ENCOUNTER — Encounter: Payer: Self-pay | Admitting: Dermatology

## 2019-11-29 ENCOUNTER — Other Ambulatory Visit: Payer: Self-pay

## 2019-11-29 DIAGNOSIS — D2372 Other benign neoplasm of skin of left lower limb, including hip: Secondary | ICD-10-CM | POA: Diagnosis not present

## 2019-11-29 DIAGNOSIS — L821 Other seborrheic keratosis: Secondary | ICD-10-CM

## 2019-11-29 DIAGNOSIS — Z1283 Encounter for screening for malignant neoplasm of skin: Secondary | ICD-10-CM

## 2019-11-29 DIAGNOSIS — D1801 Hemangioma of skin and subcutaneous tissue: Secondary | ICD-10-CM

## 2019-11-29 DIAGNOSIS — L304 Erythema intertrigo: Secondary | ICD-10-CM | POA: Diagnosis not present

## 2019-11-29 DIAGNOSIS — D229 Melanocytic nevi, unspecified: Secondary | ICD-10-CM

## 2019-11-29 DIAGNOSIS — L578 Other skin changes due to chronic exposure to nonionizing radiation: Secondary | ICD-10-CM

## 2019-11-29 DIAGNOSIS — L814 Other melanin hyperpigmentation: Secondary | ICD-10-CM

## 2019-11-29 DIAGNOSIS — D239 Other benign neoplasm of skin, unspecified: Secondary | ICD-10-CM

## 2019-11-29 DIAGNOSIS — Z86018 Personal history of other benign neoplasm: Secondary | ICD-10-CM | POA: Diagnosis not present

## 2019-11-29 MED ORDER — MOMETASONE FUROATE 0.1 % EX CREA: 1.0000 "application " | TOPICAL_CREAM | Freq: Every day | CUTANEOUS | 0 refills | Status: DC | PRN

## 2019-11-29 NOTE — Progress Notes (Signed)
   Follow-Up Visit   Subjective  Wendy Phillips is a 39 y.o. female who presents for the following: Annual Exam (TBSE - No history of skin cancer. History of dysplastic nevi - left low back paraspinal above sacral, left upper back 3.0 cm lat to spine) and Other (Spots of right arm, left leg that are irritating. One spot on back is coming back). Patient presents for total-body skin exam for skin cancer screening and mole check.   The following portions of the chart were reviewed this encounter and updated as appropriate:  Tobacco  Allergies  Meds  Problems  Med Hx  Surg Hx  Fam Hx      Review of Systems:  No other skin or systemic complaints except as noted in HPI or Assessment and Plan.  Objective  Well appearing patient in no apparent distress; mood and affect are within normal limits.  A full examination was performed including scalp, head, eyes, ears, nose, lips, neck, chest, axillae, abdomen, back, buttocks, bilateral upper extremities, bilateral lower extremities, hands, feet, fingers, toes, fingernails, and toenails. All findings within normal limits unless otherwise noted below.  Objective  Left Thigh - Posterior: Firm pink/brown papulenodule with dimple sign.   Objective  Chest - Medial [facility]): Erythema  Objective  Left low back paraspinal above sacral, left upper back 3.0 cm lat to spine: Well healed biopsy sites.   Assessment & Plan    Lentigines - Scattered tan macules - Discussed due to sun exposure - Benign, observe - Call for any changes  Seborrheic Keratoses - Stuck-on, waxy, tan-brown papules and plaques  - Discussed benign etiology and prognosis. - Observe - Call for any changes  Melanocytic Nevi - Tan-brown and/or pink-flesh-colored symmetric macules and papules - Benign appearing on exam today - Observation - Call clinic for new or changing moles - Recommend daily use of broad spectrum spf 30+ sunscreen to sun-exposed areas.    Hemangiomas - Red papules - Discussed benign nature - Observe - Call for any changes  Actinic Damage - diffuse scaly erythematous macules with underlying dyspigmentation - Recommend daily broad spectrum sunscreen SPF 30+ to sun-exposed areas, reapply every 2 hours as needed.  - Call for new or changing lesions.  Skin cancer screening performed today.   Dermatofibroma Left Thigh - Posterior  Benign. Observe   Erythema intertrigo of inter-mammary area Chest - Medial (Center)  mometasone (ELOCON) 0.1 % cream - Chest - Medial [facility])  History of dysplastic nevus Left low back paraspinal above sacral, left upper back 3.0 cm lat to spine  Clear today.  Return in about 1 year (around 11/28/2020).   I, Ashok Cordia, CMA, am acting as scribe for Sarina Ser, MD .  Documentation: I have reviewed the above documentation for accuracy and completeness, and I agree with the above.  Sarina Ser, MD

## 2019-11-30 ENCOUNTER — Encounter: Payer: Self-pay | Admitting: Dermatology

## 2019-12-22 ENCOUNTER — Encounter: Payer: Self-pay | Admitting: Plastic Surgery

## 2019-12-22 ENCOUNTER — Other Ambulatory Visit: Payer: Self-pay

## 2019-12-22 ENCOUNTER — Ambulatory Visit (INDEPENDENT_AMBULATORY_CARE_PROVIDER_SITE_OTHER): Payer: Managed Care, Other (non HMO) | Admitting: Plastic Surgery

## 2019-12-22 VITALS — BP 145/102 | HR 79 | Temp 97.7°F | Ht 60.0 in | Wt 213.6 lb

## 2019-12-22 DIAGNOSIS — N62 Hypertrophy of breast: Secondary | ICD-10-CM

## 2019-12-22 DIAGNOSIS — M545 Low back pain, unspecified: Secondary | ICD-10-CM

## 2019-12-22 DIAGNOSIS — M546 Pain in thoracic spine: Secondary | ICD-10-CM

## 2019-12-22 DIAGNOSIS — M4004 Postural kyphosis, thoracic region: Secondary | ICD-10-CM | POA: Diagnosis not present

## 2019-12-22 DIAGNOSIS — M793 Panniculitis, unspecified: Secondary | ICD-10-CM | POA: Diagnosis not present

## 2019-12-22 NOTE — Progress Notes (Signed)
Referring Provider Medicine, [facility] [address] Physical Therapy [place],  [place] [address]   CC:  Chief Complaint  Patient presents with  . Advice Only      Wendy Phillips is an 39 y.o. female.  HPI: Patient presents to discuss breast reduction.  She is had years of back pain, neck pain, shoulder grooving.  She has been to a chiropractor with little relief.  She is tried over-the-counter medications, warm packs, cold packs with no relief.  She gets rashes beneath her breast and has tried over-the-counter medications with intermittent success.  She is currently a 40 triple D and wants to be around a C cup.  There is no family history of breast cancer.  She did have a mammogram 2 years ago due to pain and that was normal.  She has not had any previous breast procedures or biopsies.  She does not smoke and is not a diabetic.  She also brought up the fact that she was bothered by her overhanging abdominal pannus.  She has lost a little bit of weight but has been at her stable weight for at least 6 months.  She complains of the overhanging skin getting compressed by her close and causing irritation in the crease that can hurt with pulling or certain movements.  She has tried creams and ointments for that with limited success.  Her only abdominal procedures are C-sections  No Known Allergies  Outpatient Encounter Medications as of 12/22/2019  Medication Sig Note  . [DISCONTINUED] doxycycline (VIBRA-TABS) 100 MG tablet Take by mouth.   . [DISCONTINUED] ibuprofen (ADVIL,MOTRIN) 600 MG tablet  01/03/2015: Received from: External Pharmacy  . [DISCONTINUED] ketorolac (TORADOL) 10 MG tablet Take 1 tablet (10 mg total) by mouth every 6 (six) hours as needed.   . [DISCONTINUED] meloxicam (MOBIC) 15 MG tablet Take 1 tablet (15 mg total) by mouth daily.   . [DISCONTINUED] methylPREDNISolone (MEDROL DOSEPAK) 4 MG TBPK tablet Take as directed   . [DISCONTINUED] metoCLOPramide (REGLAN)  10 MG tablet Take 1 tablet (10 mg total) by mouth 3 (three) times daily with meals.   . [DISCONTINUED] mometasone (ELOCON) 0.1 % cream Apply 1 application topically daily as needed (Rash).   . [DISCONTINUED] omeprazole (PRILOSEC) 40 MG capsule Take 1 capsule (40 mg total) by mouth daily.   . [DISCONTINUED] pantoprazole (PROTONIX) 40 MG tablet Take by mouth.   . [DISCONTINUED] sertraline (ZOLOFT) 50 MG tablet Take by mouth.    Facility-Administered Encounter Medications as of 12/22/2019  Medication  . betamethasone acetate-betamethasone sodium phosphate (CELESTONE) injection 3 mg  . betamethasone acetate-betamethasone sodium phosphate (CELESTONE) injection 3 mg     Past Medical History:  Diagnosis Date  . Dysplastic nevus 08/2019   left low back paraspinal above sacral, left upper back 3.0 cm lat to spine    Past Surgical History:  Procedure Laterality Date  . ABDOMINAL HYSTERECTOMY      Family History  Problem Relation Age of Onset  . Melanoma Mother     Social History   Social History Narrative  . Not on file     Review of Systems General: Denies fevers, chills, weight loss CV: Denies chest pain, shortness of breath, palpitations  Physical Exam Vitals with BMI 12/22/2019 09/13/2015 09/13/2015  Height 5\' 0"  - -  Weight 213 lbs 10 oz - -  BMI 38.25 - -  Systolic 053 976 734  Diastolic 193 790 240  Pulse 79 86 92    General:  No acute distress,  Alert and oriented, Non-Toxic, Normal speech and affect Breast: She has grade 3 ptosis.  Sternal notch to nipple is 30 cm bilaterally.  Nipple to fold is 16 cm on the right and 15 cm on the left.  I do not see any obvious scars or masses. Abdomen: Abdomen soft nontender.  I do not detect any hernias.  She has an overhanging pannus with erythematous changes within the crease.  I seated her C-section scars which have overall healed fine.  Assessment/Plan The patient has bilateral symptomatic macromastia.  She is a good candidate for  a breast reduction.  She is interested in pursuing surgical treatment.  The details of breast reduction surgery were discussed.  I explained the procedure in detail along the with the expected scars.  The risks were discussed in detail and include bleeding, infection, damage to surrounding structures, need for additional procedures, nipple loss, change in nipple sensation, persistent pain, contour irregularities and asymmetries.  I explained that breast feeding is often not possible after breast reduction surgery.  We discussed the expected postoperative course with an overall recovery period of about 1 month.  She demonstrated full understanding of all risks.  We discussed her personal risk factors.  I anticipate approximately 675g of tissue removed from each side.  We also discussed infraumbilical panniculectomy.  She does have symptomatic irritation beneath the pannus.  I discussed the details of that procedure including the scars and expected recovery process.  I discussed the need for drains.  I discussed that this would not address her upper abdomen but that has not really a big concern for her.  I discussed that during these procedures combined would add to the recovery time but I thought her risk profile was acceptable to do them both together.   Cindra Presume 12/22/2019, 4:26 PM

## 2020-01-04 ENCOUNTER — Telehealth: Payer: Self-pay | Admitting: Plastic Surgery

## 2020-01-04 NOTE — Telephone Encounter (Signed)
Called patient to advise of denial letter for surgery request. Patient was informed of the denial reason(s) and the documentation that will be needed if we try to appeal. We need to have at least three months of documented treatment for intertrigo of pannus and breast, along with photos of the rashes. Patient was also advised that the lack of interference with DAL was noted by the medical review provider, so if the pannus inhibits her daily activities of living, we need to provide medical documentation to support such. Patient will work on these items, but advised that she is likely changing insurance carriers for the new year and might wait for that to change over before resubmitting. Patient was advised to call our office with any updates or requests. She expressed understanding.

## 2020-01-05 ENCOUNTER — Institutional Professional Consult (permissible substitution): Payer: Managed Care, Other (non HMO) | Admitting: Plastic Surgery

## 2020-01-06 DIAGNOSIS — I10 Essential (primary) hypertension: Secondary | ICD-10-CM | POA: Insufficient documentation

## 2020-01-06 DIAGNOSIS — F418 Other specified anxiety disorders: Secondary | ICD-10-CM | POA: Insufficient documentation

## 2020-01-26 ENCOUNTER — Telehealth: Payer: Self-pay | Admitting: Plastic Surgery

## 2020-01-26 NOTE — Telephone Encounter (Signed)
Called patient to discuss information that was sent via e-mail regarding intertrigo under her breast and dermatology notes from May. I explained that we already have the dermatology notes, since the provider is in the [facility] system. The photos are likely not something that we can use, but I will review with Dr. Claudia Desanctis. I suggested that she come in our office to see the PA so that we can document the rash and take photos, if she currently has the rash under her breast. She explained that the photos are about three weeks old and that she cannot come in for a follow up visit, but she would be willing to do a tele-visit. I advised that I would send a message to Dr. Claudia Desanctis and Novelty, our PA, and discuss the situation with them. I will call her back once I have a response from Dr. Danley Danker and we will proceed based on their direction. Patient agreed to the plan.

## 2020-05-29 DIAGNOSIS — Z9884 Bariatric surgery status: Secondary | ICD-10-CM | POA: Insufficient documentation

## 2020-08-24 ENCOUNTER — Other Ambulatory Visit: Payer: Self-pay | Admitting: Physician Assistant

## 2020-08-24 ENCOUNTER — Other Ambulatory Visit (HOSPITAL_COMMUNITY): Payer: Self-pay | Admitting: Physician Assistant

## 2020-08-24 DIAGNOSIS — R1011 Right upper quadrant pain: Secondary | ICD-10-CM

## 2020-08-24 DIAGNOSIS — Z9884 Bariatric surgery status: Secondary | ICD-10-CM

## 2020-09-01 ENCOUNTER — Other Ambulatory Visit: Payer: Self-pay

## 2020-09-01 ENCOUNTER — Ambulatory Visit
Admission: RE | Admit: 2020-09-01 | Discharge: 2020-09-01 | Disposition: A | Discharge status: Home / Self Care | Payer: BC Managed Care – PPO | Source: Ambulatory Visit | Attending: Physician Assistant | Admitting: Physician Assistant

## 2020-09-01 DIAGNOSIS — Z9884 Bariatric surgery status: Secondary | ICD-10-CM | POA: Insufficient documentation

## 2020-09-01 DIAGNOSIS — R1011 Right upper quadrant pain: Secondary | ICD-10-CM | POA: Diagnosis present

## 2020-11-30 ENCOUNTER — Encounter: Payer: Self-pay | Admitting: Dermatology

## 2020-11-30 ENCOUNTER — Ambulatory Visit: Payer: BC Managed Care – PPO | Admitting: Dermatology

## 2020-11-30 ENCOUNTER — Other Ambulatory Visit: Payer: Self-pay

## 2020-11-30 DIAGNOSIS — D485 Neoplasm of uncertain behavior of skin: Secondary | ICD-10-CM

## 2020-11-30 DIAGNOSIS — L821 Other seborrheic keratosis: Secondary | ICD-10-CM

## 2020-11-30 DIAGNOSIS — L578 Other skin changes due to chronic exposure to nonionizing radiation: Secondary | ICD-10-CM

## 2020-11-30 DIAGNOSIS — I781 Nevus, non-neoplastic: Secondary | ICD-10-CM | POA: Diagnosis not present

## 2020-11-30 DIAGNOSIS — L814 Other melanin hyperpigmentation: Secondary | ICD-10-CM

## 2020-11-30 DIAGNOSIS — C44519 Basal cell carcinoma of skin of other part of trunk: Secondary | ICD-10-CM

## 2020-11-30 DIAGNOSIS — D1800 Hemangioma unspecified site: Secondary | ICD-10-CM

## 2020-11-30 DIAGNOSIS — Z1283 Encounter for screening for malignant neoplasm of skin: Secondary | ICD-10-CM | POA: Diagnosis not present

## 2020-11-30 DIAGNOSIS — C4491 Basal cell carcinoma of skin, unspecified: Secondary | ICD-10-CM

## 2020-11-30 DIAGNOSIS — Z86018 Personal history of other benign neoplasm: Secondary | ICD-10-CM | POA: Diagnosis not present

## 2020-11-30 DIAGNOSIS — D229 Melanocytic nevi, unspecified: Secondary | ICD-10-CM

## 2020-11-30 HISTORY — DX: Basal cell carcinoma of skin, unspecified: C44.91

## 2020-11-30 NOTE — Progress Notes (Signed)
Follow-Up Visit   Subjective  Wendy Phillips is a 40 y.o. female who presents for the following: Annual Exam (Hx dysplastic nevus ). The patient presents for Total-Body Skin Exam (TBSE) for skin cancer screening and mole check.  The following portions of the chart were reviewed this encounter and updated as appropriate:   Tobacco  Allergies  Meds  Problems  Med Hx  Surg Hx  Fam Hx     Review of Systems:  No other skin or systemic complaints except as noted in HPI or Assessment and Plan.  Objective  Well appearing patient in no apparent distress; mood and affect are within normal limits.  A full examination was performed including scalp, head, eyes, ears, nose, lips, neck, chest, axillae, abdomen, back, buttocks, bilateral upper extremities, bilateral lower extremities, hands, feet, fingers, toes, fingernails, and toenails. All findings within normal limits unless otherwise noted below.  Objective  R upper back: 0.8 cm pink patch   Objective  L nasal tip, R upper lip: Dilated vessels.  Images      Assessment & Plan  Neoplasm of uncertain behavior of skin R upper back  Epidermal / dermal shaving  Lesion diameter (cm):  0.8 Informed consent: discussed and consent obtained   Timeout: patient name, date of birth, surgical site, and procedure verified   Procedure prep:  Patient was prepped and draped in usual sterile fashion Prep type:  Isopropyl alcohol Anesthesia: the lesion was anesthetized in a standard fashion   Anesthetic:  1% lidocaine w/ epinephrine 1-100,000 buffered w/ 8.4% NaHCO3 Instrument used: flexible razor blade   Hemostasis achieved with: pressure, aluminum chloride and electrodesiccation   Outcome: patient tolerated procedure well   Post-procedure details: sterile dressing applied and wound care instructions given   Dressing type: bandage and petrolatum    Destruction of lesion Complexity: extensive   Destruction method: electrodesiccation  and curettage   Informed consent: discussed and consent obtained   Timeout:  patient name, date of birth, surgical site, and procedure verified Procedure prep:  Patient was prepped and draped in usual sterile fashion Prep type:  Isopropyl alcohol Anesthesia: the lesion was anesthetized in a standard fashion   Anesthetic:  1% lidocaine w/ epinephrine 1-100,000 buffered w/ 8.4% NaHCO3 Curettage performed in three different directions: Yes   Electrodesiccation performed over the curetted area: Yes   Lesion length (cm):  0.8 Lesion width (cm):  0.8 Margin per side (cm):  0.2 Final wound size (cm):  1.2 Hemostasis achieved with:  pressure, aluminum chloride and electrodesiccation Outcome: patient tolerated procedure well with no complications   Post-procedure details: sterile dressing applied and wound care instructions given   Dressing type: bandage and petrolatum    Anatomic Pathology Report  Telangiectasia (2) L nasal tip; R upper lip  Benign-appearing.  Observation.  Call clinic for new or changing lesions.  Recommend daily use of broad spectrum spf 30+ sunscreen to sun-exposed areas.   Discussed BBL laser treatment. Advised not covered by  insurance and there it an out of pocket fee.    Skin cancer screening   Lentigines - Scattered tan macules - Due to sun exposure - Benign-appering, observe - Recommend daily broad spectrum sunscreen SPF 30+ to sun-exposed areas, reapply every 2 hours as needed. - Call for any changes  Seborrheic Keratoses - Stuck-on, waxy, tan-brown papules and/or plaques  - Benign-appearing - Discussed benign etiology and prognosis. - Observe - Call for any changes  Melanocytic Nevi - Tan-brown and/or pink-flesh-colored symmetric  macules and papules - Benign appearing on exam today - Observation - Call clinic for new or changing moles - Recommend daily use of broad spectrum spf 30+ sunscreen to sun-exposed areas.   Hemangiomas - Red papules -  Discussed benign nature - Observe - Call for any changes  Actinic Damage - Chronic condition, secondary to cumulative UV/sun exposure - diffuse scaly erythematous macules with underlying dyspigmentation - Recommend daily broad spectrum sunscreen SPF 30+ to sun-exposed areas, reapply every 2 hours as needed.  - Staying in the shade or wearing long sleeves, sun glasses (UVA+UVB protection) and wide brim hats (4-inch brim around the entire circumference of the hat) are also recommended for sun protection.  - Call for new or changing lesions.  History of Dysplastic Nevi  - No evidence of recurrence today - Recommend regular full body skin exams - Recommend daily broad spectrum sunscreen SPF 30+ to sun-exposed areas, reapply every 2 hours as needed.  - Call if any new or changing lesions are noted between office visits  Skin cancer screening performed today.  Return in about 1 year (around 11/30/2021) for TBSE - hx dysplastic nevus; BBL laser for telangiectasia x 2.  Luther Redo, CMA, am acting as scribe for Sarina Ser, MD .  Documentation: I have reviewed the above documentation for accuracy and completeness, and I agree with the above.  Sarina Ser, MD

## 2020-11-30 NOTE — Patient Instructions (Addendum)
If you have any questions or concerns for your doctor, please call our main line at [phone] and press option 4 to reach your doctor's medical assistant. If no one answers, please leave a voicemail as directed and we will return your call as soon as possible. Messages left after 4 pm will be answered the following business day.   You may also send [place] a message via [place]. We typically respond to MyChart messages within 1-2 business days.  For prescription refills, please ask your pharmacy to contact our office. Our fax number is [phone].  If you have an urgent issue when the clinic is closed that cannot wait until the next business day, you can page your doctor at the number below.    Please note that while we do our best to be available for urgent issues outside of office hours, we are not available 24/7.   If you have an urgent issue and are unable to reach [place], you may choose to seek medical care at your doctor's office, retail clinic, urgent care center, or emergency room.  If you have a medical emergency, please immediately call 911 or go to the emergency department.  Pager Numbers  - Dr. Nehemiah Massed: [phone]  - Dr. Laurence Ferrari: [phone]  - Dr. Nicole Kindred: [phone]  In the event of inclement weather, please call our main line at [phone] for an update on the status of any delays or closures.  Dermatology Medication Tips: Please keep the boxes that topical medications come in in order to help keep track of the instructions about where and how to use these. Pharmacies typically print the medication instructions only on the boxes and not directly on the medication tubes.   If your medication is too expensive, please contact our office at [phone] option 4 or send [place] a message through [place].   We are unable to tell what your co-pay for medications will be in advance as this is different depending on your insurance coverage. However, we may be able to find a substitute  medication at lower cost or fill out paperwork to get insurance to cover a needed medication.   If a prior authorization is required to get your medication covered by your insurance company, please allow [place] 1-2 business days to complete this process.  Drug prices often vary depending on where the prescription is filled and some pharmacies may offer cheaper prices.  The website www.goodrx.com contains coupons for medications through different pharmacies. The prices here do not account for what the cost may be with help from insurance (it may be cheaper with your insurance), but the website can give you the price if you did not use any insurance.  - You can print the associated coupon and take it with your prescription to the pharmacy.  - You may also stop by our office during regular business hours and pick up a GoodRx coupon card.  - If you need your prescription sent electronically to a different pharmacy, notify our office through [facility] or by phone at [phone] option 4. Electrodesiccation and Curettage ("Scrape and Burn") Wound Care Instructions  1. Leave the original bandage on for 24 hours if possible.  If the bandage becomes soaked or soiled before that time, it is OK to remove it and examine the wound.  A small amount of post-operative bleeding is normal.  If excessive bleeding occurs, remove the bandage, place gauze over the site and apply continuous pressure (no peeking) over the area for 30 minutes.  If this does not work, please call our clinic as soon as possible or page your doctor if it is after hours.   2. Once a day, cleanse the wound with soap and water. It is fine to shower. If a thick crust develops you may use a Q-tip dipped into dilute hydrogen peroxide (mix 1:1 with water) to dissolve it.  Hydrogen peroxide can slow the healing process, so use it only as needed.    3. After washing, apply petroleum jelly (Vaseline) or an antibiotic ointment if your doctor  prescribed one for you, followed by a bandage.    4. For best healing, the wound should be covered with a layer of ointment at all times. If you are not able to keep the area covered with a bandage to hold the ointment in place, this may mean re-applying the ointment several times a day.  Continue this wound care until the wound has healed and is no longer open. It may take several weeks for the wound to heal and close.  Itching and mild discomfort is normal during the healing process.  If you have any discomfort, you can take Tylenol (acetaminophen) or ibuprofen as directed on the bottle. (Please do not take these if you have an allergy to them or cannot take them for another reason).  Some redness, tenderness and white or yellow material in the wound is normal healing.  If the area becomes very sore and red, or develops a thick yellow-green material (pus), it may be infected; please notify us.    Wound healing continues for up to one year following surgery. It is not unusual to experience pain in the scar from time to time during the interval.  If the pain becomes severe or the scar thickens, you should notify the office.    A slight amount of redness in a scar is expected for the first six months.  After six months, the redness will fade and the scar will soften and fade.  The color difference becomes less noticeable with time.  If there are any problems, return for a post-op surgery check at your earliest convenience.  To improve the appearance of the scar, you can use silicone scar gel, cream, or sheets (such as Mederma or Serica) every night for up to one year. These are available over the counter (without a prescription).  Please call our office at [phone] for any questions or concerns.

## 2020-12-04 ENCOUNTER — Encounter: Payer: Self-pay | Admitting: Dermatology

## 2020-12-08 LAB — ANATOMIC PATHOLOGY REPORT

## 2020-12-11 ENCOUNTER — Telehealth: Payer: Self-pay

## 2020-12-11 NOTE — Telephone Encounter (Signed)
Patient informed of pathology results 

## 2020-12-11 NOTE — Telephone Encounter (Signed)
-----   Message from Ralene Bathe, MD sent at 12/08/2020  1:15 PM EDT ----- Diagnosis: See below:Abnormal  Comment: Specimen 1-BASAL CELL CARCINOMA, NODULAR AND MICRONODULAR  TYPE. THE DEEP MARGIN IS INVOLVED  Cancer - BCC As suspected Already treated Recheck next visit

## 2021-01-04 DIAGNOSIS — M778 Other enthesopathies, not elsewhere classified: Secondary | ICD-10-CM | POA: Insufficient documentation

## 2021-08-15 ENCOUNTER — Other Ambulatory Visit: Payer: Self-pay

## 2021-08-15 ENCOUNTER — Ambulatory Visit: Payer: BC Managed Care – PPO | Admitting: Dermatology

## 2021-08-15 DIAGNOSIS — D229 Melanocytic nevi, unspecified: Secondary | ICD-10-CM

## 2021-08-15 DIAGNOSIS — C44519 Basal cell carcinoma of skin of other part of trunk: Secondary | ICD-10-CM

## 2021-08-15 DIAGNOSIS — L814 Other melanin hyperpigmentation: Secondary | ICD-10-CM

## 2021-08-15 DIAGNOSIS — L821 Other seborrheic keratosis: Secondary | ICD-10-CM

## 2021-08-15 DIAGNOSIS — I781 Nevus, non-neoplastic: Secondary | ICD-10-CM

## 2021-08-15 DIAGNOSIS — D2272 Melanocytic nevi of left lower limb, including hip: Secondary | ICD-10-CM

## 2021-08-15 DIAGNOSIS — L820 Inflamed seborrheic keratosis: Secondary | ICD-10-CM | POA: Diagnosis not present

## 2021-08-15 DIAGNOSIS — Z85828 Personal history of other malignant neoplasm of skin: Secondary | ICD-10-CM

## 2021-08-15 DIAGNOSIS — Z86018 Personal history of other benign neoplasm: Secondary | ICD-10-CM

## 2021-08-15 DIAGNOSIS — Z1283 Encounter for screening for malignant neoplasm of skin: Secondary | ICD-10-CM | POA: Diagnosis not present

## 2021-08-15 DIAGNOSIS — L578 Other skin changes due to chronic exposure to nonionizing radiation: Secondary | ICD-10-CM

## 2021-08-15 DIAGNOSIS — L918 Other hypertrophic disorders of the skin: Secondary | ICD-10-CM

## 2021-08-15 DIAGNOSIS — D1800 Hemangioma unspecified site: Secondary | ICD-10-CM

## 2021-08-15 DIAGNOSIS — D492 Neoplasm of unspecified behavior of bone, soft tissue, and skin: Secondary | ICD-10-CM

## 2021-08-15 NOTE — Patient Instructions (Addendum)

## 2021-08-15 NOTE — Progress Notes (Signed)
Follow-Up Visit   Subjective  Wendy Phillips is a 41 y.o. female who presents for the following: Total body skin exam (Hx of BCC R upper back, hx of Dysplastic Nevus left low back paraspinal above sacral, left upper back 3.0 cm lat to spine), check spot (Back, itchy/), Dermotofibroma (L post thigh, itchy, has been scratching and bleeding), and Skin tags (Inframammary, shoulders, irritated by necklace and bra). The patient presents for Total-Body Skin Exam (TBSE) for skin cancer screening and mole check.  The patient has spots, moles and lesions to be evaluated, some may be new or changing and the patient has concerns that these could be cancer.   The following portions of the chart were reviewed this encounter and updated as appropriate:   Tobacco   Allergies   Meds   Problems   Med Hx   Surg Hx   Fam Hx      Review of Systems:  No other skin or systemic complaints except as noted in HPI or Assessment and Plan.  Objective  Well appearing patient in no apparent distress; mood and affect are within normal limits.  A full examination was performed including scalp, head, eyes, ears, nose, lips, neck, chest, axillae, abdomen, back, buttocks, bilateral upper extremities, bilateral lower extremities, hands, feet, fingers, toes, fingernails, and toenails. All findings within normal limits unless otherwise noted below.  Right Upper Back Well healed scar with no evidence of recurrence.   left low back paraspinal above sacral, left upper back 3.0 cm lat to spine Scars with no evidence of recurrence.   L scapula 1.2cm pink patch with crust     L posterior thigh Dimpling flesh/pink pap 0.7cm     upper lip telangiectasia     R xyphoid x 1 Fleshy, skin-colored pedunculated papule  R and L neck x 10 (10) Stuck on waxy paps with erythema    Assessment & Plan   Lentigines - Scattered tan macules - Due to sun exposure - Benign-appearing, observe - Recommend daily broad  spectrum sunscreen SPF 30+ to sun-exposed areas, reapply every 2 hours as needed. - Call for any changes  Seborrheic Keratoses - Stuck-on, waxy, tan-brown papules and/or plaques  - Benign-appearing - Discussed benign etiology and prognosis. - Observe - Call for any changes  Melanocytic Nevi - Tan-brown and/or pink-flesh-colored symmetric macules and papules - Benign appearing on exam today - Observation - Call clinic for new or changing moles - Recommend daily use of broad spectrum spf 30+ sunscreen to sun-exposed areas.   Hemangiomas - Red papules - Discussed benign nature - Observe - Call for any changes  Actinic Damage - Chronic condition, secondary to cumulative UV/sun exposure - diffuse scaly erythematous macules with underlying dyspigmentation - Recommend daily broad spectrum sunscreen SPF 30+ to sun-exposed areas, reapply every 2 hours as needed.  - Staying in the shade or wearing long sleeves, sun glasses (UVA+UVB protection) and wide brim hats (4-inch brim around the entire circumference of the hat) are also recommended for sun protection.  - Call for new or changing lesions.  Skin cancer screening performed today.  History of basal cell carcinoma (BCC) Right Upper Back Clear. Observe for recurrence. Call clinic for new or changing lesions.  Recommend regular skin exams, daily broad-spectrum spf 30+ sunscreen use, and photoprotection.    History of dysplastic nevus left low back paraspinal above sacral, left upper back 3.0 cm lat to spine Clear. Observe for recurrence. Call clinic for new or changing lesions.  Recommend regular skin exams, daily broad-spectrum spf 30+ sunscreen use, and photoprotection.    Neoplasm of skin (2) L scapula Epidermal / dermal shaving  Lesion diameter (cm):  1.2 Informed consent: discussed and consent obtained   Timeout: patient name, date of birth, surgical site, and procedure verified   Procedure prep:  Patient was prepped and  draped in usual sterile fashion Prep type:  Isopropyl alcohol Anesthesia: the lesion was anesthetized in a standard fashion   Anesthetic:  1% lidocaine w/ epinephrine 1-100,000 buffered w/ 8.4% NaHCO3 Instrument used: flexible razor blade   Hemostasis achieved with: pressure, aluminum chloride and electrodesiccation   Outcome: patient tolerated procedure well   Post-procedure details: sterile dressing applied and wound care instructions given   Dressing type: bandage and bacitracin    Destruction of lesion Complexity: extensive   Destruction method: electrodesiccation and curettage   Informed consent: discussed and consent obtained   Timeout:  patient name, date of birth, surgical site, and procedure verified Procedure prep:  Patient was prepped and draped in usual sterile fashion Prep type:  Isopropyl alcohol Anesthesia: the lesion was anesthetized in a standard fashion   Anesthetic:  1% lidocaine w/ epinephrine 1-100,000 buffered w/ 8.4% NaHCO3 Curettage performed in three different directions: Yes   Electrodesiccation performed over the curetted area: Yes   Lesion length (cm):  1.2 Lesion width (cm):  1.2 Margin per side (cm):  0.2 Final wound size (cm):  1.6 Hemostasis achieved with:  pressure, aluminum chloride and electrodesiccation Outcome: patient tolerated procedure well with no complications   Post-procedure details: sterile dressing applied and wound care instructions given   Dressing type: bandage and bacitracin    L posterior thigh Epidermal / dermal shaving  Lesion diameter (cm):  0.7 Informed consent: discussed and consent obtained   Timeout: patient name, date of birth, surgical site, and procedure verified   Procedure prep:  Patient was prepped and draped in usual sterile fashion Prep type:  Isopropyl alcohol Anesthesia: the lesion was anesthetized in a standard fashion   Anesthetic:  1% lidocaine w/ epinephrine 1-100,000 buffered w/ 8.4% NaHCO3 Instrument  used: flexible razor blade   Hemostasis achieved with: pressure, aluminum chloride and electrodesiccation   Outcome: patient tolerated procedure well   Post-procedure details: sterile dressing applied and wound care instructions given   Dressing type: bandage and bacitracin    Related Procedures Anatomic Pathology Report  Telangiectasia upper lip Benign Discussed BBL, $200 per treatment for one spot  Skin tag R xyphoid x 1 - Patient desires removal. Reviewed that this is not covered by insurance and they will be charged a cosmetic fee for removal. Patient signed non-covered consent.  - Prior to the procedure, reviewed the expected small wound. Also reviewed the risk of leaving a small scar and the small risk of infection.   Epidermal / dermal shaving - R xyphoid x 1 Informed consent: discussed and consent obtained   Anesthesia: the lesion was anesthetized in a standard fashion   Anesthetic:  1% lidocaine w/ epinephrine 1-100,000 buffered w/ 8.4% NaHCO3 Instrument used: scissors   Hemostasis achieved with: pressure, aluminum chloride and electrodesiccation   Outcome: patient tolerated procedure well   Post-procedure details: wound care instructions given   Additional details:  X 1  Inflamed seborrheic keratosis (10) R and L neck x 10 Destruction of lesion - R and L neck x 10 Complexity: simple   Destruction method: cryotherapy   Informed consent: discussed and consent obtained   Timeout:  patient  name, date of birth, surgical site, and procedure verified Lesion destroyed using liquid nitrogen: Yes   Region frozen until ice ball extended beyond lesion: Yes   Outcome: patient tolerated procedure well with no complications   Post-procedure details: wound care instructions given    Skin cancer screening  Return in about 1 year (around 08/15/2022) for TBSE, Hx of BCC, Hx of Dysplastic nevi.  I, Othelia Pulling, RMA, am acting as scribe for Sarina Ser, MD . Documentation: I have  reviewed the above documentation for accuracy and completeness, and I agree with the above.  Sarina Ser, MD

## 2021-08-19 ENCOUNTER — Encounter: Payer: Self-pay | Admitting: Dermatology

## 2021-08-20 LAB — ANATOMIC PATHOLOGY REPORT

## 2021-08-21 ENCOUNTER — Telehealth: Payer: Self-pay

## 2021-08-21 NOTE — Telephone Encounter (Signed)
-----   Message from Ralene Bathe, MD sent at 08/20/2021 12:17 PM EST ----- Diagnosis synopsis: See below:Abnormal  Comment: Specimen 1-Skin Biopsy, Left Scapula: BASAL CELL CARCINOMA,  SUPERFICIAL TYPE.  SEE COMMENTS.  Specimen 2-Skin Biopsy, Left Post Thigh: DERMATOFIBROMA  1- Cancer - BCC Already treated Recheck next visit 2- benign dermatofibroma As suspected May recur Recheck next visit

## 2021-08-21 NOTE — Telephone Encounter (Signed)
Discussed biopsy results with pt  °

## 2021-10-02 ENCOUNTER — Other Ambulatory Visit: Payer: Self-pay

## 2021-10-02 ENCOUNTER — Ambulatory Visit (INDEPENDENT_AMBULATORY_CARE_PROVIDER_SITE_OTHER): Payer: Self-pay | Admitting: Dermatology

## 2021-10-02 DIAGNOSIS — I781 Nevus, non-neoplastic: Secondary | ICD-10-CM

## 2021-10-02 DIAGNOSIS — L578 Other skin changes due to chronic exposure to nonionizing radiation: Secondary | ICD-10-CM

## 2021-10-02 DIAGNOSIS — L814 Other melanin hyperpigmentation: Secondary | ICD-10-CM

## 2021-10-02 MED ORDER — VALACYCLOVIR HCL 500 MG PO TABS: 500.0000 mg | ORAL_TABLET | Freq: Two times a day (BID) | ORAL | 11 refills | Status: DC

## 2021-10-02 NOTE — Progress Notes (Signed)
? ?Follow-Up Visit ?  ?Subjective  ?Wendy Phillips is a 41 y.o. female who presents for the following: Telangiectasia (Upper lip, pt presents for BBL) and spot on nose and L cheek (Pt presents for laser/BBL treatment today for telangiectasias and brown spots. ? ?The following portions of the chart were reviewed this encounter and updated as appropriate:  ? Tobacco  Allergies  Meds  Problems  Med Hx  Surg Hx  Fam Hx   ?  ?Review of Systems:  No other skin or systemic complaints except as noted in HPI or Assessment and Plan. ? ?Objective  ?Well appearing patient in no apparent distress; mood and affect are within normal limits. ? ?A focused examination was performed including face. Relevant physical exam findings are noted in the Assessment and Plan. ? ?upper lip, L nasal tip ?Telangiectasias upper lip, L nasal tip ? ? ? ? ? ? ? ? ?face ?Actinic damage face ? ?face ?Scattered tan macules.  ? ? ? ? ? ? ? ? ? ? ? ? ? ? ? ? ? ? ? ?Assessment & Plan  ?Telangiectasia ?upper lip, L nasal tip ? ?Vascular BBL today ? ?Start Valtrex 527m 1 po bid for 7 days starting today. ? ?valACYclovir (VALTREX) 500 MG tablet - upper lip, L nasal tip ?Take 1 tablet (500 mg total) by mouth 2 (two) times daily. Take 1 po bid for 7 days ? ?Photorejuvenation - upper lip, L nasal tip ?Prior to the procedure, the patient's past medical history, medications, allergies, and the rare but potential risks and complications were reviewed with the patient and a signed consent was obtained.  Pre and post treatment care was discussed and instructions provided. ? ? [place] BBL - 10/03/21 0700   ?  ? Patient Details  ? Skin Type: II   ? Anesthestic Cream Applied: No   ? Photo Takes: Yes   ? Consent Signed: Yes   ?  ? Treatment Details  ? Date: 10/02/21   ? Treatment #: 1   ? Area: face   ? Filter: 1st Pass;2nd Pass;3rd Pass   ?  ? 1st Pass  ? Device: Vascular BBL, Filter 560   ? BBL j/cm2: 28   ? PW Msec Sec: 26   ? Cooling Temp: 20   ? Pulses: 6    ? 74m 6 pulses with this crystal   ?  ? Patient tolerated the procedure well.  ? ?SuNancy Fettervoidance was stressed. The patient will call with any problems, questions or concerns prior to their next appointment. ? ?Actinic skin damage ?face ?BBL to face today  ?Cont spf and photoprotection qd. ? ?Lentigines ?face ?BBL to face today. ?BBL laser kit given to pt today. ? ?Photorejuvenation - face ?Prior to the procedure, the patient's past medical history, medications, allergies, and the rare but potential risks and complications were reviewed with the patient and a signed consent was obtained.  Pre and post treatment care was discussed and instructions provided. ? ? [place] BBL - 10/03/21 0700   ?  ? Patient Details  ? Skin Type: II   ? Anesthestic Cream Applied: No   ? Photo Takes: Yes   ? Consent Signed: Yes   ?  ? Treatment Details  ? Date: 10/02/21   ? Treatment #: 1   ? Area: face   ? Filter: 1st Pass;2nd Pass;3rd Pass   ?   ? 2nd Pass  ? Location: F   L cheek 2 spots  ?  Device: Filter 515   ? BBL j/cm2: 15   ? PW Msec Sec: 15   ? Cooling Temp: 15   ? Pulses: 4   ? 69m: 4 pulses this crystal   ?  ? 3rd Pass  ? Location: F   ? Device: Filter 515   ? BBL j/cm2: 12   ? PW Msec Sec: 10   ? Cooling Temp: 25   ? Pulses: 146   ? 15x45: 146 pulses with this crystal   ?  ?Patient tolerated the procedure well.  ? ?SNancy Fetteravoidance was stressed. The patient will call with any problems, questions or concerns prior to their next appointment. ? ?Return in about 6 weeks (around 11/13/2021) for BBL. ? ?I, SOthelia Pulling RMA, am acting as scribe for DSarina Ser MD . ?Documentation: I have reviewed the above documentation for accuracy and completeness, and I agree with the above. ? ?DSarina Ser MD ? ?

## 2021-10-02 NOTE — Patient Instructions (Signed)

## 2021-10-04 ENCOUNTER — Encounter: Payer: Self-pay | Admitting: Dermatology

## 2021-11-27 ENCOUNTER — Ambulatory Visit: Payer: BC Managed Care – PPO | Admitting: Dermatology

## 2021-12-05 ENCOUNTER — Encounter: Payer: Managed Care, Other (non HMO) | Admitting: Dermatology

## 2022-06-04 ENCOUNTER — Other Ambulatory Visit: Payer: Self-pay | Admitting: Obstetrics and Gynecology

## 2022-06-04 DIAGNOSIS — Z1231 Encounter for screening mammogram for malignant neoplasm of breast: Secondary | ICD-10-CM

## 2022-08-12 ENCOUNTER — Encounter: Payer: Self-pay | Admitting: Family Medicine

## 2022-08-12 ENCOUNTER — Ambulatory Visit: Payer: Managed Care, Other (non HMO) | Admitting: Family Medicine

## 2022-08-12 ENCOUNTER — Telehealth: Payer: Self-pay | Admitting: Family Medicine

## 2022-08-12 VITALS — BP 127/91 | HR 85 | Temp 97.7°F | Ht 60.0 in | Wt 140.2 lb

## 2022-08-12 DIAGNOSIS — F4321 Adjustment disorder with depressed mood: Secondary | ICD-10-CM

## 2022-08-12 DIAGNOSIS — F5105 Insomnia due to other mental disorder: Secondary | ICD-10-CM

## 2022-08-12 DIAGNOSIS — F99 Mental disorder, not otherwise specified: Secondary | ICD-10-CM | POA: Diagnosis not present

## 2022-08-12 DIAGNOSIS — F322 Major depressive disorder, single episode, severe without psychotic features: Secondary | ICD-10-CM

## 2022-08-12 DIAGNOSIS — Z7689 Persons encountering health services in other specified circumstances: Secondary | ICD-10-CM

## 2022-08-12 MED ORDER — CITALOPRAM HYDROBROMIDE 10 MG PO TABS: 10.0000 mg | ORAL_TABLET | Freq: Every day | ORAL | 3 refills | Status: DC
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
## 2022-08-12 NOTE — Telephone Encounter (Signed)
Short term disability paperwork completed. Will need to pay fee and please make copy to scan to chart, fax to number provided on form. Will be in my box.

## 2022-08-13 ENCOUNTER — Ambulatory Visit
Admission: RE | Admit: 2022-08-13 | Discharge: 2022-08-13 | Disposition: A | Discharge status: Home / Self Care | Payer: Managed Care, Other (non HMO) | Source: Ambulatory Visit | Attending: Obstetrics and Gynecology | Admitting: Obstetrics and Gynecology

## 2022-08-13 ENCOUNTER — Telehealth: Payer: Self-pay | Admitting: Family Medicine

## 2022-08-13 DIAGNOSIS — Z1231 Encounter for screening mammogram for malignant neoplasm of breast: Secondary | ICD-10-CM | POA: Diagnosis present

## 2022-08-13 NOTE — Telephone Encounter (Signed)
Patient called and stated she provided the paperwork for short term disability but also requires paperwork for FMLA. Patient requested email to send FMLA paperwork to, email provided. Patient requested FMLA notate reasoning for her being out a few days every month for the purpose of other appointments. Patient ntoified of potential fees and turn around time. Paperwork will be placed in provider box when received from email. Wendy Phillips

## 2022-08-14 NOTE — Telephone Encounter (Signed)
Paperwork has been completed and picked up by patient. Faxed to number indicated on paperwork per patient instruction. Patient is aware of fee pending for paperwork but was notified that, due to our inability to take paperwork payments at this time, it may be due at a later date or waived at provider discretion. Wendy Phillips

## 2022-08-14 NOTE — Telephone Encounter (Signed)
noted 

## 2022-08-14 NOTE — Telephone Encounter (Signed)
Paperwork has been completed and picked up by patient. Faxed to number indicated on paperwork per patient instruction. Patient is aware of fee pending for paperwork but was notified that, due to our inability to take paperwork payments at this time, it may be due at a later date or waived at provider discretion. Buel Ream

## 2022-08-14 NOTE — Telephone Encounter (Signed)
Completed FMLA paperwork and will be in my box for patient to pick up. She has the first page she needs to complete before sending in all documents.

## 2022-08-15 ENCOUNTER — Ambulatory Visit (INDEPENDENT_AMBULATORY_CARE_PROVIDER_SITE_OTHER): Payer: Managed Care, Other (non HMO) | Admitting: Dermatology

## 2022-08-15 ENCOUNTER — Encounter: Payer: Self-pay | Admitting: Family Medicine

## 2022-08-15 ENCOUNTER — Telehealth: Payer: Self-pay | Admitting: Family Medicine

## 2022-08-15 VITALS — BP 107/81

## 2022-08-15 DIAGNOSIS — Z1283 Encounter for screening for malignant neoplasm of skin: Secondary | ICD-10-CM

## 2022-08-15 DIAGNOSIS — L820 Inflamed seborrheic keratosis: Secondary | ICD-10-CM | POA: Diagnosis not present

## 2022-08-15 DIAGNOSIS — L814 Other melanin hyperpigmentation: Secondary | ICD-10-CM | POA: Diagnosis not present

## 2022-08-15 DIAGNOSIS — D1801 Hemangioma of skin and subcutaneous tissue: Secondary | ICD-10-CM

## 2022-08-15 DIAGNOSIS — Z86018 Personal history of other benign neoplasm: Secondary | ICD-10-CM

## 2022-08-15 DIAGNOSIS — D229 Melanocytic nevi, unspecified: Secondary | ICD-10-CM | POA: Diagnosis not present

## 2022-08-15 DIAGNOSIS — L578 Other skin changes due to chronic exposure to nonionizing radiation: Secondary | ICD-10-CM

## 2022-08-15 DIAGNOSIS — Z85828 Personal history of other malignant neoplasm of skin: Secondary | ICD-10-CM

## 2022-08-15 DIAGNOSIS — L821 Other seborrheic keratosis: Secondary | ICD-10-CM

## 2022-08-15 NOTE — Patient Instructions (Signed)
Cryotherapy Aftercare  Wash gently with soap and water everyday.   Apply Vaseline and Band-Aid daily until healed.     Due to recent changes in healthcare laws, you may see results of your pathology and/or laboratory studies on MyChart before the doctors have had a chance to review them. We understand that in some cases there may be results that are confusing or concerning to you. Please understand that not all results are received at the same time and often the doctors may need to interpret multiple results in order to provide you with the best plan of care or course of treatment. Therefore, we ask that you please give us 2 business days to thoroughly review all your results before contacting the office for clarification. Should we see a critical lab result, you will be contacted sooner.   If You Need Anything After Your Visit  If you have any questions or concerns for your doctor, please call our main line at [phone] and press option 4 to reach your doctor's medical assistant. If no one answers, please leave a voicemail as directed and we will return your call as soon as possible. Messages left after 4 pm will be answered the following business day.   You may also send us a message via MyChart. We typically respond to MyChart messages within 1-2 business days.  For prescription refills, please ask your pharmacy to contact our office. Our fax number is [phone].  If you have an urgent issue when the clinic is closed that cannot wait until the next business day, you can page your doctor at the number below.    Please note that while we do our best to be available for urgent issues outside of office hours, we are not available 24/7.   If you have an urgent issue and are unable to reach us, you may choose to seek medical care at your doctor's office, retail clinic, urgent care center, or emergency room.  If you have a medical emergency, please immediately call 911 or go to the  emergency department.  Pager Numbers  - Dr. Kowalski: [phone]  - Dr. Moye: [phone]  - Dr. Stewart: [phone]  In the event of inclement weather, please call our main line at [phone] for an update on the status of any delays or closures.  Dermatology Medication Tips: Please keep the boxes that topical medications come in in order to help keep track of the instructions about where and how to use these. Pharmacies typically print the medication instructions only on the boxes and not directly on the medication tubes.   If your medication is too expensive, please contact our office at [phone] option 4 or send us a message through MyChart.   We are unable to tell what your co-pay for medications will be in advance as this is different depending on your insurance coverage. However, we may be able to find a substitute medication at lower cost or fill out paperwork to get insurance to cover a needed medication.   If a prior authorization is required to get your medication covered by your insurance company, please allow us 1-2 business days to complete this process.  Drug prices often vary depending on where the prescription is filled and some pharmacies may offer cheaper prices.  The website www.goodrx.com contains coupons for medications through different pharmacies. The prices here do not account for what the cost may be with help from insurance (it may be cheaper with your insurance), but the website can   give you the price if you did not use any insurance.  - You can print the associated coupon and take it with your prescription to the pharmacy.  - You may also stop by our office during regular business hours and pick up a GoodRx coupon card.  - If you need your prescription sent electronically to a different pharmacy, notify our office through [facility] MyChart or by phone at [phone] option 4.     Si Usted Necesita Algo Despus de Su Visita  Tambin puede  enviarnos un mensaje a travs de MyChart. Por lo general respondemos a los mensajes de MyChart en el transcurso de 1 a 2 das hbiles.  Para renovar recetas, por favor pida a su farmacia que se ponga en contacto con nuestra oficina. Nuestro nmero de fax es el [phone].  Si tiene un asunto urgente cuando la clnica est cerrada y que no puede esperar hasta el siguiente da hbil, puede llamar/localizar a su doctor(a) al nmero que aparece a continuacin.   Por favor, tenga en cuenta que aunque hacemos todo lo posible para estar disponibles para asuntos urgentes fuera del horario de oficina, no estamos disponibles las 24 horas del da, los 7 das de la semana.   Si tiene un problema urgente y no puede comunicarse con nosotros, puede optar por buscar atencin mdica  en el consultorio de su doctor(a), en una clnica privada, en un centro de atencin urgente o en una sala de emergencias.  Si tiene una emergencia mdica, por favor llame inmediatamente al 911 o vaya a la sala de emergencias.  Nmeros de bper  - Dr. Kowalski: [phone]  - Dra. Moye: [phone]  - Dra. Stewart: [phone]  En caso de inclemencias del tiempo, por favor llame a nuestra lnea principal al [phone] para una actualizacin sobre el estado de cualquier retraso o cierre.  Consejos para la medicacin en dermatologa: Por favor, guarde las cajas en las que vienen los medicamentos de uso tpico para ayudarle a seguir las instrucciones sobre dnde y cmo usarlos. Las farmacias generalmente imprimen las instrucciones del medicamento slo en las cajas y no directamente en los tubos del medicamento.   Si su medicamento es muy caro, por favor, pngase en contacto con nuestra oficina llamando al [phone] y presione la opcin 4 o envenos un mensaje a travs de MyChart.   No podemos decirle cul ser su copago por los medicamentos por adelantado ya que esto es diferente dependiendo de la cobertura de su seguro.  Sin embargo, es posible que podamos encontrar un medicamento sustituto a menor costo o llenar un formulario para que el seguro cubra el medicamento que se considera necesario.   Si se requiere una autorizacin previa para que su compaa de seguros cubra su medicamento, por favor permtanos de 1 a 2 das hbiles para completar este proceso.  Los precios de los medicamentos varan con frecuencia dependiendo del lugar de dnde se surte la receta y alguna farmacias pueden ofrecer precios ms baratos.  El sitio web www.goodrx.com tiene cupones para medicamentos de diferentes farmacias. Los precios aqu no tienen en cuenta lo que podra costar con la ayuda del seguro (puede ser ms barato con su seguro), pero el sitio web puede darle el precio si no utiliz ningn seguro.  - Puede imprimir el cupn correspondiente y llevarlo con su receta a la farmacia.  - Tambin puede pasar por nuestra oficina durante el horario de atencin regular y recoger una tarjeta de cupones de GoodRx.  -   Si necesita que su receta se enve electrnicamente a una farmacia diferente, informe a nuestra oficina a travs de MyChart de [facility] o por telfono llamando al [phone] y presione la opcin 4.  

## 2022-08-15 NOTE — Telephone Encounter (Signed)
Faxed to number provided on last page.  Sent FMLA/Disability documents for scan into chart, copy scanned into documents. Buel Ream

## 2022-08-15 NOTE — Progress Notes (Signed)
Follow-Up Visit   Subjective  Wendy Phillips is a 42 y.o. female who presents for the following: Annual Exam (History of BCC and dysplastic nevus - The patient presents for Total-Body Skin Exam (TBSE) for skin cancer screening and mole check.  The patient has spots, moles and lesions to be evaluated, some may be new or changing and the patient has concerns that these could be cancer./).  The following portions of the chart were reviewed this encounter and updated as appropriate:   Tobacco  Allergies  Meds  Problems  Med Hx  Surg Hx  Fam Hx     Review of Systems:  No other skin or systemic complaints except as noted in HPI or Assessment and Plan.  Objective  Well appearing patient in no apparent distress; mood and affect are within normal limits.  A full examination was performed including scalp, head, eyes, ears, nose, lips, neck, chest, axillae, abdomen, back, buttocks, bilateral upper extremities, bilateral lower extremities, hands, feet, fingers, toes, fingernails, and toenails. All findings within normal limits unless otherwise noted below.  Mid chest x 1, back x 2 (3) Erythematous stuck-on, waxy papule or plaque   Assessment & Plan   History of Basal Cell Carcinoma of the Skin - No evidence of recurrence today - Recommend regular full body skin exams - Recommend daily broad spectrum sunscreen SPF 30+ to sun-exposed areas, reapply every 2 hours as needed.  - Call if any new or changing lesions are noted between office visits  History of Dysplastic Nevi - No evidence of recurrence today - Recommend regular full body skin exams - Recommend daily broad spectrum sunscreen SPF 30+ to sun-exposed areas, reapply every 2 hours as needed.  - Call if any new or changing lesions are noted between office visits  Lentigines - Scattered tan macules - Due to sun exposure - Benign-appearing, observe - Recommend daily broad spectrum sunscreen SPF 30+ to sun-exposed areas, reapply  every 2 hours as needed. - Call for any changes  Seborrheic Keratoses - Stuck-on, waxy, tan-brown papules and/or plaques  - Benign-appearing - Discussed benign etiology and prognosis. - Observe - Call for any changes  Melanocytic Nevi - Tan-brown and/or pink-flesh-colored symmetric macules and papules - Benign appearing on exam today - Observation - Call clinic for new or changing moles - Recommend daily use of broad spectrum spf 30+ sunscreen to sun-exposed areas.   Hemangiomas - Red papules - Discussed benign nature - Observe - Call for any changes  Actinic Damage - Chronic condition, secondary to cumulative UV/sun exposure - diffuse scaly erythematous macules with underlying dyspigmentation - Recommend daily broad spectrum sunscreen SPF 30+ to sun-exposed areas, reapply every 2 hours as needed.  - Staying in the shade or wearing long sleeves, sun glasses (UVA+UVB protection) and wide brim hats (4-inch brim around the entire circumference of the hat) are also recommended for sun protection.  - Call for new or changing lesions.  Skin cancer screening performed today.  Inflamed seborrheic keratosis (3) Mid chest x 1, back x 2  Destruction of lesion - Mid chest x 1, back x 2 Complexity: simple   Destruction method: cryotherapy   Informed consent: discussed and consent obtained   Timeout:  patient name, date of birth, surgical site, and procedure verified Lesion destroyed using liquid nitrogen: Yes   Region frozen until ice ball extended beyond lesion: Yes   Outcome: patient tolerated procedure well with no complications   Post-procedure details: wound care instructions given  Return in about 1 year (around 08/16/2023) for TBSE.  I, Ashok Cordia, CMA, am acting as scribe for Sarina Ser, MD . Documentation: I have reviewed the above documentation for accuracy and completeness, and I agree with the above.  Sarina Ser, MD

## 2022-08-15 NOTE — Telephone Encounter (Signed)
Received another FMLA form/disability document to complete for patient.  3rd document completed to date. Please copy and have scanned to chart. Also may fax to # provided on last page.

## 2022-08-22 ENCOUNTER — Ambulatory Visit: Payer: BC Managed Care – PPO | Admitting: Dermatology

## 2022-08-25 ENCOUNTER — Encounter: Payer: Self-pay | Admitting: Family Medicine

## 2022-08-27 ENCOUNTER — Encounter: Payer: Self-pay | Admitting: Dermatology

## 2022-09-05 ENCOUNTER — Ambulatory Visit: Payer: BC Managed Care – PPO | Admitting: Dermatology

## 2023-05-19 ENCOUNTER — Other Ambulatory Visit: Payer: Self-pay | Admitting: Obstetrics and Gynecology

## 2023-05-19 DIAGNOSIS — Z1231 Encounter for screening mammogram for malignant neoplasm of breast: Secondary | ICD-10-CM

## 2023-08-18 ENCOUNTER — Ambulatory Visit
Admission: RE | Admit: 2023-08-18 | Discharge: 2023-08-18 | Disposition: A | Discharge status: Home / Self Care | Payer: Managed Care, Other (non HMO) | Source: Ambulatory Visit | Attending: Obstetrics and Gynecology | Admitting: Obstetrics and Gynecology

## 2023-08-18 DIAGNOSIS — Z1231 Encounter for screening mammogram for malignant neoplasm of breast: Secondary | ICD-10-CM | POA: Insufficient documentation

## 2023-08-20 ENCOUNTER — Ambulatory Visit: Payer: Managed Care, Other (non HMO) | Admitting: Dermatology

## 2023-09-04 ENCOUNTER — Emergency Department: Payer: Managed Care, Other (non HMO)

## 2023-09-04 ENCOUNTER — Emergency Department
Admission: EM | Admit: 2023-09-04 | Discharge: 2023-09-04 | Disposition: A | Discharge status: Home / Self Care | Payer: Managed Care, Other (non HMO) | Attending: Emergency Medicine | Admitting: Emergency Medicine

## 2023-09-04 ENCOUNTER — Other Ambulatory Visit: Payer: Self-pay

## 2023-09-04 DIAGNOSIS — I10 Essential (primary) hypertension: Secondary | ICD-10-CM | POA: Insufficient documentation

## 2023-09-04 DIAGNOSIS — S63501A Unspecified sprain of right wrist, initial encounter: Secondary | ICD-10-CM | POA: Diagnosis not present

## 2023-09-04 DIAGNOSIS — Y92410 Unspecified street and highway as the place of occurrence of the external cause: Secondary | ICD-10-CM | POA: Diagnosis not present

## 2023-09-04 DIAGNOSIS — S20219A Contusion of unspecified front wall of thorax, initial encounter: Secondary | ICD-10-CM | POA: Insufficient documentation

## 2023-09-04 DIAGNOSIS — M25531 Pain in right wrist: Secondary | ICD-10-CM | POA: Diagnosis present

## 2023-09-04 LAB — BASIC METABOLIC PANEL
Anion gap: 11 (ref 5–15)
BUN: 14 mg/dL (ref 6–20)
CO2: 24 mmol/L (ref 22–32)
Calcium: 8.9 mg/dL (ref 8.9–10.3)
Chloride: 107 mmol/L (ref 98–111)
Creatinine, Ser: 0.55 mg/dL (ref 0.44–1.00)
GFR, Estimated: >60 mL/min (ref >60)
Glucose, Bld: 115 mg/dL — ABNORMAL HIGH (ref 70–99)
Potassium: 3.4 mmol/L — ABNORMAL LOW (ref 3.5–5.1)
Sodium: 142 mmol/L (ref 135–145)

## 2023-09-04 LAB — CBC WITH DIFFERENTIAL/PLATELET
Abs Immature Granulocytes: 0.01 10*3/uL (ref 0.00–0.07)
Basophils Absolute: 0 10*3/uL (ref 0.0–0.1)
Basophils Relative: 0 %
Eosinophils Absolute: 0 10*3/uL (ref 0.0–0.5)
Eosinophils Relative: 1 %
HCT: 33 % — ABNORMAL LOW (ref 36.0–46.0)
Hemoglobin: 11.2 g/dL — ABNORMAL LOW (ref 12.0–15.0)
Immature Granulocytes: 0 %
Lymphocytes Relative: 26 %
Lymphs Abs: 1.7 10*3/uL (ref 0.7–4.0)
MCH: 30.9 pg (ref 26.0–34.0)
MCHC: 33.9 g/dL (ref 30.0–36.0)
MCV: 90.9 fL (ref 80.0–100.0)
Monocytes Absolute: 0.3 10*3/uL (ref 0.1–1.0)
Monocytes Relative: 4 %
Neutro Abs: 4.4 10*3/uL (ref 1.7–7.7)
Neutrophils Relative %: 69 %
Platelets: 137 10*3/uL — ABNORMAL LOW (ref 150–400)
RBC: 3.63 MIL/uL — ABNORMAL LOW (ref 3.87–5.11)
RDW: 12 % (ref 11.5–15.5)
WBC: 6.5 10*3/uL (ref 4.0–10.5)
nRBC: 0 % (ref 0.0–0.2)

## 2023-09-04 LAB — TROPONIN I (HIGH SENSITIVITY): Troponin I (High Sensitivity): 6 ng/L (ref <18)
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
## 2023-09-04 NOTE — Discharge Instructions (Addendum)
 Take Tylenol or ibuprofen as needed for pain.  We have given you a referral information for orthopedist for follow-up for the wrist.  Return to the ER for new, worsening, or persistent severe chest pain, difficulty breathing, severe headache, vomiting, dizziness, or any other new or worsening symptoms that concern you.
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
## 2023-10-01 ENCOUNTER — Encounter: Payer: Self-pay | Admitting: Dermatology

## 2023-10-01 ENCOUNTER — Ambulatory Visit (INDEPENDENT_AMBULATORY_CARE_PROVIDER_SITE_OTHER): Payer: Managed Care, Other (non HMO) | Admitting: Dermatology

## 2023-10-01 DIAGNOSIS — W908XXA Exposure to other nonionizing radiation, initial encounter: Secondary | ICD-10-CM

## 2023-10-01 DIAGNOSIS — L578 Other skin changes due to chronic exposure to nonionizing radiation: Secondary | ICD-10-CM | POA: Diagnosis not present

## 2023-10-01 DIAGNOSIS — D225 Melanocytic nevi of trunk: Secondary | ICD-10-CM | POA: Diagnosis not present

## 2023-10-01 DIAGNOSIS — Z85828 Personal history of other malignant neoplasm of skin: Secondary | ICD-10-CM

## 2023-10-01 DIAGNOSIS — L821 Other seborrheic keratosis: Secondary | ICD-10-CM

## 2023-10-01 DIAGNOSIS — Z86018 Personal history of other benign neoplasm: Secondary | ICD-10-CM

## 2023-10-01 DIAGNOSIS — I781 Nevus, non-neoplastic: Secondary | ICD-10-CM

## 2023-10-01 DIAGNOSIS — D492 Neoplasm of unspecified behavior of bone, soft tissue, and skin: Secondary | ICD-10-CM

## 2023-10-01 DIAGNOSIS — D229 Melanocytic nevi, unspecified: Secondary | ICD-10-CM

## 2023-10-01 DIAGNOSIS — Z1283 Encounter for screening for malignant neoplasm of skin: Secondary | ICD-10-CM | POA: Diagnosis not present

## 2023-10-01 DIAGNOSIS — D1801 Hemangioma of skin and subcutaneous tissue: Secondary | ICD-10-CM

## 2023-10-01 DIAGNOSIS — L814 Other melanin hyperpigmentation: Secondary | ICD-10-CM

## 2023-10-01 NOTE — Patient Instructions (Addendum)

## 2023-10-01 NOTE — Progress Notes (Signed)
 Follow-Up Visit   Subjective  Wendy Phillips is a 43 y.o. female who presents for the following: Skin Cancer Screening and Full Body Skin Exam hx of BCCs, Dysplastic nevus  The patient presents for Total-Body Skin Exam (TBSE) for skin cancer screening and mole check. The patient has spots, moles and lesions to be evaluated, some may be new or changing and the patient may have concern these could be cancer.  The following portions of the chart were reviewed this encounter and updated as appropriate: medications, allergies, medical history  Review of Systems:  No other skin or systemic complaints except as noted in HPI or Assessment and Plan.  Objective  Well appearing patient in no apparent distress; mood and affect are within normal limits.  A full examination was performed including scalp, head, eyes, ears, nose, lips, neck, chest, axillae, abdomen, back, buttocks, bilateral upper extremities, bilateral lower extremities, hands, feet, fingers, toes, fingernails, and toenails. All findings within normal limits unless otherwise noted below.   Relevant physical exam findings are noted in the Assessment and Plan.  R sup buttocks 0.6cm irregular brown macule   Assessment & Plan   SKIN CANCER SCREENING PERFORMED TODAY.  ACTINIC DAMAGE - Chronic condition, secondary to cumulative UV/sun exposure - diffuse scaly erythematous macules with underlying dyspigmentation - Recommend daily broad spectrum sunscreen SPF 30+ to sun-exposed areas, reapply every 2 hours as needed.  - Staying in the shade or wearing long sleeves, sun glasses (UVA+UVB protection) and wide brim hats (4-inch brim around the entire circumference of the hat) are also recommended for sun protection.  - Call for new or changing lesions.  LENTIGINES, SEBORRHEIC KERATOSES, HEMANGIOMAS - Benign normal skin lesions - Benign-appearing - Call for any changes  MELANOCYTIC NEVI - Tan-brown and/or pink-flesh-colored symmetric  macules and papules - Benign appearing on exam today - Observation - Call clinic for new or changing moles - Recommend daily use of broad spectrum spf 30+ sunscreen to sun-exposed areas.   HISTORY OF BASAL CELL CARCINOMA OF THE SKIN - No evidence of recurrence today - Recommend regular full body skin exams - Recommend daily broad spectrum sunscreen SPF 30+ to sun-exposed areas, reapply every 2 hours as needed.  - Call if any new or changing lesions are noted between office visits  - R upper back, L scapula  HISTORY OF DYSPLASTIC NEVUS No evidence of recurrence today Recommend regular full body skin exams Recommend daily broad spectrum sunscreen SPF 30+ to sun-exposed areas, reapply every 2 hours as needed.  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 Improved with previous BBL Exam: blanching telangiectasia Treatment Plan: Benign, discussed can take multiple treatments for best results  NEOPLASM OF SKIN R sup buttocks Epidermal / dermal shaving  Lesion diameter (cm):  0.6 Informed consent: discussed and consent obtained   Timeout: patient name, date of birth, surgical site, and procedure verified   Procedure prep:  Patient was prepped and draped in usual sterile fashion Prep type:  Isopropyl alcohol Anesthesia: the lesion was anesthetized in a standard fashion   Anesthetic:  1% lidocaine w/ epinephrine 1-100,000 buffered w/ 8.4% NaHCO3 Instrument used: flexible razor blade   Hemostasis achieved with: pressure, aluminum chloride and electrodesiccation   Outcome: patient tolerated procedure well   Post-procedure details: sterile dressing applied and wound care instructions  given   Dressing type: bandage and bacitracin   Related Procedures Anatomic Pathology Report Return in about 1 year (around 09/30/2024) for TBSE,  Hx of BCC, Hx of Dysplastic nevi.  I, Ardis Rowan, RMA, am acting as scribe for Armida Sans, MD .   Documentation: I have reviewed the above documentation for accuracy and completeness, and I agree with the above.  Armida Sans, MD

## 2023-10-08 ENCOUNTER — Encounter: Payer: Self-pay | Admitting: Dermatology

## 2023-10-08 ENCOUNTER — Telehealth: Payer: Self-pay

## 2023-10-08 LAB — ANATOMIC PATHOLOGY REPORT

## 2023-10-08 NOTE — Telephone Encounter (Addendum)
 Called and discussed pathology with patient. She verbalized understanding and denied further questions.  ----- Message from Wendy Phillips sent at 10/08/2023  9:52 AM EDT ----- Diagnosis synopsis: Comment Comment: Part A-right superior buttocks ,Skin Biopsy: LENTIGINOUS JUNCTIONAL NEVUS.  Benign mole No further treatment needed

## 2024-03-12 ENCOUNTER — Other Ambulatory Visit: Payer: Self-pay | Admitting: Obstetrics and Gynecology

## 2024-03-12 DIAGNOSIS — Z1231 Encounter for screening mammogram for malignant neoplasm of breast: Secondary | ICD-10-CM

## 2024-04-14 ENCOUNTER — Institutional Professional Consult (permissible substitution)

## 2024-04-16 ENCOUNTER — Ambulatory Visit

## 2024-04-16 VITALS — BP 131/83 | HR 83 | Ht 61.0 in | Wt 127.0 lb

## 2024-04-16 DIAGNOSIS — R21 Rash and other nonspecific skin eruption: Secondary | ICD-10-CM

## 2024-04-16 DIAGNOSIS — Z6824 Body mass index (BMI) 24.0-24.9, adult: Secondary | ICD-10-CM

## 2024-04-16 DIAGNOSIS — M549 Dorsalgia, unspecified: Secondary | ICD-10-CM

## 2024-04-16 DIAGNOSIS — N62 Hypertrophy of breast: Secondary | ICD-10-CM

## 2024-04-16 DIAGNOSIS — M542 Cervicalgia: Secondary | ICD-10-CM | POA: Diagnosis not present

## 2024-04-16 NOTE — Progress Notes (Signed)
 BREAST REDUCTION CONSULT Plastic & Reconstructive Surgery New Patient Visit  Patient: Wendy Phillips MRN: [id number] Date: 04/16/2024 Referring Physician: @REFERRINGPHYSICIAN @   Chief Complaint: Symptomatic macromastia, cervicalgia  History of Present Illness:  This is a 43 y.o. woman with PMH and PSH as described below who presents in consultation for breast reduction.   The patient states she has been considering a breast reduction for years. She describes intermittent skin irritation in the breast folds and occasional rashes.   Back pain in the upper and lower back, including neck pain. She pulls or pins her bra straps to provide better lift and relief of the pressure and pain. She notices relief by holding her breast up manually.  Her shoulder straps cause grooves and pain and pressure that requires padding for relief. Pain medication is sometimes required with motrin and tylenol .  Activities that are hindered by enlarged breasts include: exercise and running.  She has tried supportive clothing as well as fitted bras without improvement.  Patient states she currently wears a DDD cup bra and would ideally like to be a B-C cup.   She has no personal history of breast abnormalities and has never had any breast biopsies or surgeries.  Her last mammogram was this year and was normal.   She no recent weight changes. Of note, she has is done child bearing.   Past Medical History: Past Medical History:  Diagnosis Date   Basal cell carcinoma 11/30/2020   R upper back - ED&C    Basal cell carcinoma 08/15/2021   Left Scapula, treated with EDC   Dysplastic nevus 08/2019   left low back paraspinal above sacral, left upper back 3.0 cm lat to spine   Past Surgical History: Past Surgical History:  Procedure Laterality Date   ABDOMINAL HYSTERECTOMY  2013   GASTRIC BYPASS  2021   Current Medications: Current Outpatient Medications on File Prior to Visit  Medication Sig Dispense Refill    dimenhyDRINATE (DRAMAMINE) 50 MG tablet Take 50 mg by mouth daily as needed for nausea.     meloxicam  (MOBIC ) 15 MG tablet Take 15 mg by mouth daily.     rizatriptan (MAXALT-MLT) 10 MG disintegrating tablet Take 10 mg by mouth as needed.     traZODone (DESYREL) 100 MG tablet Take 100 mg by mouth at bedtime.     No current facility-administered medications on file prior to visit.   Allergies: No Known Allergies  Family History:  History of breast cancer negative in her family. Otherwise, family history is negative for bleeding/clotting disorders, problems with anesthesia, connective tissue disorders.   Social History:  Social History   Socioeconomic History   Marital status: Single    Spouse name: Not on file   Number of children: Not on file   Years of education: Not on file   Highest education level: Not on file  Occupational History   Not on file  Tobacco Use   Smoking status: Never   Smokeless tobacco: Never  Substance and Sexual Activity   Alcohol use: Not Currently   Drug use: Never   Sexual activity: Yes  Other Topics Concern   Not on file  Social History Narrative   Not on file   Social Drivers of Health   Financial Resource Strain: Low Risk  (03/03/2024)   Received from [facility]   Overall Financial Resource Strain (CARDIA)    Difficulty of Paying Living Expenses: Not hard at all  Food Insecurity: No Food Insecurity (  03/03/2024)   Received from [facility]   Hunger Vital Sign    Within the past 12 months, you worried that your food would run out before you got the money to buy more.: Never true    Within the past 12 months, the food you bought just didn't last and you didn't have money to get more.: Never true  Transportation Needs: No Transportation Needs (03/03/2024)   Received from [facility] - Transportation    In the past 12 months, has lack of transportation kept you from medical  appointments or from getting medications?: No    Lack of Transportation (Non-Medical): No  Physical Activity: Not on file  Stress: Not on file  Social Connections: Not on file   Smoker: Denies Recreational drug use: Denies  Review of systems: 10 point review of systems performed and negative except as noted in the HPI  Physical Exam: BP 131/83 (BP Location: Left Arm, Patient Position: Sitting, Cuff Size: Normal)   Pulse 83   Ht 5' 1 (1.549 m)   Wt 127 lb (57.6 kg)   SpO2 99%   BMI 24.00 kg/m  Body mass index is 24 kg/m.  Physical Exam           General: Well appearing, no apparent distress. Chest: Chest wall without abnormality or obvious deformity. No scoliosis, pectus excavatum or pectus carinatum. Breast: Bilateral macromastia. Grade 3 ptosis bilaterally.. Breast parenchyma is ffatty. There are no palpable breast masses in either breast. Bilateral NAC viable and sensate. Papules everted without discharge. NACs positioned along breast meridian bilaterally. Skin quality is poor. There are no skin lesions, scars or dimpling.  - Breast measurements:  Measurements  Right Breast (cm)  Left Breast (cm)  SN-Nipple 26 27  Base Width 13 14  Nipple - IMF 10 10.5  NAC diameter 45*5 4.5*5.5  Internipple Distance   Neuro: Moving all four extremities spontaneously.  Psych: Appropriate mood and affect.   Pertinent Labs: No results found for this or any previous visit (from the past 2160 hours).  Pertinent Imaging:  Assessment: In summary, this is a pleasant 43 y.o. year-old woman presenting for consultation for bilateral breast reduction in setting of symptomatic macromastia.   After evaluation today, I believe the patient would be an appropriate candidate for the operation. Based on her Schnur scale, she would require a 270 g reduction per side. I think a 270 g resection would be attainable through a reduction mammoplasty. We discussed the operation in detail including the  potential incision patterns (e.g., vertical only versus wise pattern), and possible need for surgical drains. Based on her clinical exam, she will likely require a wise pattern with inferior pedicle.   We discussed the risks of this procedure which include but are not limited to: bleeding, infection, seroma, delayed wound healing, wound dehiscence, asymmetries, fat necrosis, hypertrophic and keloid scarring, decreased or loss of nipple sensation, partial or full loss of the NAC, numbness, paresthesias, injuries to arteries/nerves/veins, need for revision procedures, further out-of-pocket expenses for ongoing medical care, and potential need for repeat reduction in the future. We discussed that pregnancy can alter the size and shape of her breasts and revision procedures may be required after pregnancy. We discussed that while she has the same potential to breast feed as a woman who has never had a breast reduction, she may have less milk production and may require formula supplementation. We further discussed the risk of DVT/PE, fat embolism,  heart attack, stroke, death as well as the risks of anesthesia. We reviewed the expected recovery period with no heavy activities or lifting >5lbs for 6 weeks postoperatively.   The patient voiced understanding and wishes to proceed. All questions and concerns were addressed to the patient's apparent satisfaction.   Plan: - Plan for bilateral reduction mammaplasty with liposuction - 4 hours under general anesthesia. - Will submit for pre-determination with insurance. - Scheduling pending insurance. - PCP clearance   The sensitive parts of the examination/procedure were performed with MA as chaperone.  The time documented represents the total time spent on the day of the encounter in preparing for and completing the visit. It does not include time spent by ancillary staff, a resident, a fellow, another trainee, or, for shared visits, time spent jointly with the  patient or discussing the case or the performance of other separately performed services.  Time spent: 45 minutes.     Renella Steig, MD [facility]  04/16/2024 4:41 PM

## 2024-04-19 ENCOUNTER — Telehealth: Payer: Self-pay

## 2024-04-19 NOTE — Telephone Encounter (Signed)
 Faxed Req for Surgical Clearance form to paitent's PCP: Dr. Colette with confirmed receipt.

## 2024-04-23 ENCOUNTER — Emergency Department

## 2024-04-23 ENCOUNTER — Other Ambulatory Visit: Payer: Self-pay

## 2024-04-23 ENCOUNTER — Emergency Department
Admission: EM | Admit: 2024-04-23 | Discharge: 2024-04-23 | Disposition: A | Discharge status: Home / Self Care | Attending: Emergency Medicine | Admitting: Emergency Medicine

## 2024-04-23 DIAGNOSIS — H532 Diplopia: Secondary | ICD-10-CM | POA: Diagnosis not present

## 2024-04-23 DIAGNOSIS — I10 Essential (primary) hypertension: Secondary | ICD-10-CM | POA: Insufficient documentation

## 2024-04-23 DIAGNOSIS — G43019 Migraine without aura, intractable, without status migrainosus: Secondary | ICD-10-CM | POA: Insufficient documentation

## 2024-04-23 DIAGNOSIS — Z85828 Personal history of other malignant neoplasm of skin: Secondary | ICD-10-CM | POA: Insufficient documentation

## 2024-04-23 DIAGNOSIS — R519 Headache, unspecified: Secondary | ICD-10-CM | POA: Diagnosis present

## 2024-04-23 LAB — COMPREHENSIVE METABOLIC PANEL WITH GFR
ALT: 15 U/L (ref 0–44)
AST: 16 U/L (ref 15–41)
Albumin: 4.1 g/dL (ref 3.5–5.0)
Alkaline Phosphatase: 63 U/L (ref 38–126)
Anion gap: 13 (ref 5–15)
BUN: 16 mg/dL (ref 6–20)
CO2: 23 mmol/L (ref 22–32)
Calcium: 9 mg/dL (ref 8.9–10.3)
Chloride: 105 mmol/L (ref 98–111)
Creatinine, Ser: 0.72 mg/dL (ref 0.44–1.00)
GFR, Estimated: >60 mL/min (ref >60)
Glucose, Bld: 94 mg/dL (ref 70–99)
Potassium: 3.5 mmol/L (ref 3.5–5.1)
Sodium: 141 mmol/L (ref 135–145)
Total Bilirubin: 0.2 mg/dL (ref 0.0–1.2)
Total Protein: 6.8 g/dL (ref 6.5–8.1)

## 2024-04-23 LAB — CBC
HCT: 31.5 % — ABNORMAL LOW (ref 36.0–46.0)
Hemoglobin: 10.7 g/dL — ABNORMAL LOW (ref 12.0–15.0)
MCH: 30.8 pg (ref 26.0–34.0)
MCHC: 34 g/dL (ref 30.0–36.0)
MCV: 90.8 fL (ref 80.0–100.0)
Platelets: 167 K/uL (ref 150–400)
RBC: 3.47 MIL/uL — ABNORMAL LOW (ref 3.87–5.11)
RDW: 11.9 % (ref 11.5–15.5)
WBC: 4.4 K/uL (ref 4.0–10.5)
nRBC: 0 % (ref 0.0–0.2)

## 2024-04-23 MED ORDER — IOHEXOL 350 MG/ML SOLN
75.0000 mL | Freq: Once | INTRAVENOUS | Status: AC | PRN
  Administered 2024-04-23: 75 mL via INTRAVENOUS

## 2024-04-23 MED ORDER — ASPIRIN-ACETAMINOPHEN-CAFFEINE 250-250-65 MG PO TABS: 1.0000 | ORAL_TABLET | Freq: Three times a day (TID) | ORAL | 0 refills | Status: DC | PRN

## 2024-04-23 MED ORDER — SODIUM CHLORIDE 0.9 % IV SOLN
25.0000 mg | Freq: Four times a day (QID) | INTRAVENOUS | Status: DC | PRN
  Administered 2024-04-23: 25 mg via INTRAVENOUS
  Filled 2024-04-23: qty 1

## 2024-04-23 MED ORDER — MELOXICAM 15 MG PO TABS: 15.0000 mg | ORAL_TABLET | Freq: Every day | ORAL | 0 refills | Status: AC

## 2024-04-23 MED ORDER — DIPHENHYDRAMINE HCL 50 MG/ML IJ SOLN
50.0000 mg | Freq: Once | INTRAMUSCULAR | Status: AC
  Administered 2024-04-23: 50 mg via INTRAVENOUS
  Filled 2024-04-23: qty 1

## 2024-04-23 MED ORDER — DEXAMETHASONE SOD PHOSPHATE PF 10 MG/ML IJ SOLN
10.0000 mg | Freq: Once | INTRAMUSCULAR | Status: AC
  Administered 2024-04-23: 10 mg via INTRAVENOUS

## 2024-04-23 MED ORDER — MORPHINE SULFATE (PF) 2 MG/ML IV SOLN
2.0000 mg | Freq: Once | INTRAVENOUS | Status: AC
  Administered 2024-04-23: 2 mg via INTRAVENOUS
  Filled 2024-04-23: qty 1

## 2024-04-23 MED ORDER — KETOROLAC TROMETHAMINE 30 MG/ML IJ SOLN
30.0000 mg | Freq: Once | INTRAMUSCULAR | Status: AC
  Administered 2024-04-23: 30 mg via INTRAVENOUS
  Filled 2024-04-23: qty 1

## 2024-04-23 MED ORDER — AMLODIPINE BESYLATE 5 MG PO TABS: 5.0000 mg | ORAL_TABLET | Freq: Every day | ORAL | 0 refills | Status: DC

## 2024-04-23 NOTE — Discharge Instructions (Addendum)
 You have been seen in the Emergency Department (ED) for a migraine.  Please use Excedrin or Meloxicam  as needed for symptoms, but only as written on the box, and take any regular medications that have been prescribed for you. Continue your Rizatriptan/Maxalt-MLT. As we have discussed, please follow up with your doctor as soon as possible regarding today's ED visit and your headache symptoms.    Call your doctor or return to the Emergency Department (ED) if you have a worsening headache, sudden and severe headache, confusion, slurred speech, facial droop, weakness or numbness in any arm or leg, extreme fatigue, or other symptoms that concern you.   I will also send a blood pressure medication for you in case you are on a role to follow-up with your doctor relatively quickly to get your blood pressure rechecked and see if you need to be started on blood pressure medication.

## 2024-04-23 NOTE — ED Provider Notes (Signed)
 [facility] Provider Note    None    (approximate)   History   Migraine   HPI  Wendy Phillips is a 43 y.o. female  with a past medical history of GERD, migraines, hypertension, basal cell carcinoma, status post gastric bypass surgery (2021) presents to the emergency department with throbbing headache since 3 days ago on Tuesday.  Patient states that it wraps around her head like a band, goes down onto the right side of her head and into her right side of her neck.  Also endorses photophobia, nausea, vomiting every day, blurry and double vision, and has been unable to eat much due to her pain.  Reports she has not had a migraine in multiple years, but this one came on suddenly and her Maxalt has not been helping her at all with her pain.  Reports this headache is different from her usual migraines many years ago. Denies fever, recent illness, cough or nasal congestion, chest pain, SOB, abdominal pain. Patient's family does have a history of brain aneurysms.  Physical Exam   Triage Vital Signs: ED Triage Vitals [04/23/24 1855]  Encounter Vitals Group     BP (!) 139/100     Girls Systolic BP Percentile      Girls Diastolic BP Percentile      Boys Systolic BP Percentile      Boys Diastolic BP Percentile      Pulse Rate 70     Resp 18     Temp 97.9 F (36.6 C)     Temp src      SpO2 100 %     Weight 123 lb (55.8 kg)     Height 5' 1 (1.549 m)     Head Circumference      Peak Flow      Pain Score 10     Pain Loc      Pain Education      Exclude from Growth Chart     Most recent vital signs: Vitals:   04/23/24 2004 04/23/24 2046  BP:  (!) 141/96  Pulse:  68  Resp:  16  Temp:    SpO2: 100% 100%    General: Awake, in no acute distress. Appears stated age. Head: Normocephalic, atraumatic. Eyes: PERRLA. EOMs intact. No scleral icterus or conjunctival injection. Neck: Supple, no lymphadenopathy, no nuchal rigidity. CV: Good peripheral perfusion.  No edema.  Respiratory:Normal respiratory effort.  No respiratory distress. CTAB. GI: Soft, non-distended, non-tender.  MSK: Normal ROM and  5/5 strength in b/l upper and lower extremities.  Skin:Warm, dry, intact. No rashes, lesions, or ecchymosis. No cyanosis or pallor. Neurological: A&Ox4 to person, place, time, and situation. Cranial nerves II-XII intact. Sensation intact. Strength symmetric. No focal deficits. Negative Romberg. Appropriate gait with good balance.  Normal finger-to-nose testing. Psychiatric: Mood and affect appropriate. Thought processes coherent.   ED Results / Procedures / Treatments   Labs (all labs ordered are listed, but only abnormal results are displayed) Labs Reviewed  CBC - Abnormal; Notable for the following components:      Result Value   RBC 3.47 (*)    Hemoglobin 10.7 (*)    HCT 31.5 (*)    All other components within normal limits  COMPREHENSIVE METABOLIC PANEL WITH GFR     EKG     RADIOLOGY CT angio head and neck ordered.  FINDINGS:   CT HEAD: BRAIN: No acute intraparenchymal hemorrhage. No mass lesion. No CT evidence for acute  territorial infarct. No midline shift or extra-axial collection.   VENTRICLES: No hydrocephalus.   ORBITS: The orbits are unremarkable.   SINUSES AND MASTOIDS: The paranasal sinuses and mastoid air cells are clear.   CTA NECK: COMMON CAROTID ARTERIES: No significant stenosis. No dissection or occlusion.   INTERNAL CAROTID ARTERIES: No stenosis by NASCET criteria. No dissection or occlusion.   VERTEBRAL ARTERIES: No significant stenosis. No dissection or occlusion.   CTA HEAD: ANTERIOR CEREBRAL ARTERIES: No significant stenosis. No occlusion. No aneurysm.   MIDDLE CEREBRAL ARTERIES: No significant stenosis. No occlusion. No aneurysm.   POSTERIOR CEREBRAL ARTERIES: No significant stenosis. No occlusion. No aneurysm.   BASILAR ARTERY: No significant stenosis. No occlusion. No aneurysm.    OTHER: SOFT TISSUES: No acute finding. No masses or lymphadenopathy.   BONES: No acute osseous abnormality.   IMPRESSION: 1. Normal CTA of the head and neck. 2. No acute intracranial abnormality.  PROCEDURES:  Critical Care performed: No   Procedures   MEDICATIONS ORDERED IN ED: Medications  promethazine (PHENERGAN) 25 mg in sodium chloride 0.9 % 50 mL IVPB (0 mg Intravenous Stopped 04/23/24 2146)  ketorolac  (TORADOL ) 30 MG/ML injection 30 mg (30 mg Intravenous Given 04/23/24 2022)  diphenhydrAMINE  (BENADRYL ) injection 50 mg (50 mg Intravenous Given 04/23/24 2022)  iohexol (OMNIPAQUE) 350 MG/ML injection 75 mL (75 mLs Intravenous Contrast Given 04/23/24 2126)  morphine (PF) 2 MG/ML injection 2 mg (2 mg Intravenous Given 04/23/24 2221)  dexamethasone (DECADRON) injection 10 mg (10 mg Intravenous Given 04/23/24 2220)     IMPRESSION / MDM / ASSESSMENT AND PLAN / ED COURSE  I reviewed the triage vital signs and the nursing notes.                              Differential diagnosis includes, but is not limited to, migraine, tension headache, brain aneurysm  Patient's presentation is most consistent with acute complicated illness / injury requiring diagnostic workup.  Clinical Course as of 04/23/24 2308  Fri Apr 23, 2024  2050 Patient with signs and symptoms as described above.  Physical exam is reassuring with no FND but due to patient's subjective symptoms of diplopia, blurry vision and ongoing symptoms with headache different than her previous and family history of brain aneurysms, I discussed patient with supervising physician Dr. Waylon Cassis and felt that CBC, CMP and CT angio head and neck were warranted given family history.  Migraine cocktail started. [SD]  2211 Reports pain has decreased slightly, but asking for more pain meds. Added one dose Dexamethasone and Morphine. [SD]  2304 Reevaluated patient and she is feeling much better.  CT angio of the head and neck  is without any acute findings.  Hemoglobin slightly decreased at 10.7, hematocrit 31.5 and white blood cell count 4.4 within normal range.  CMP reassuring.  Patient feels ready to go home.  Will send her with prescriptions for Excedrin and meloxicam .  Told her to continue taking her rizatriptan.  Did provide her with amlodipine 5 mg low-dose prescription for hypertension given that she used to take blood pressure medication a long time ago but no longer does. Elevated BP may be due to her pain. Will have her follow-up with her PCP outpatient for blood pressure recheck and regarding today's visit. [SD]    Clinical Course User Index [SD] Sheron Salm, PA-C     FINAL CLINICAL IMPRESSION(S) / ED DIAGNOSES   Final diagnoses:  Intractable migraine without  aura and without status migrainosus     Rx / DC Orders   ED Discharge Orders          Ordered    amLODipine (NORVASC) 5 MG tablet  Daily        04/23/24 2259    aspirin-acetaminophen -caffeine (EXCEDRIN MIGRAINE) 250-250-65 MG tablet  Every 8 hours PRN        04/23/24 2259    meloxicam  (MOBIC ) 15 MG tablet  Daily        04/23/24 2259             Note:  This document was prepared using Dragon voice recognition software and may include unintentional dictation errors.     Sheron Salm, PA-C 04/23/24 2308    Jacolyn Pae, MD 04/23/24 2356

## 2024-04-23 NOTE — ED Notes (Signed)
 Pt verbalizes understanding of discharge instructions.

## 2024-04-23 NOTE — ED Triage Notes (Signed)
 Pt comes with migraine since Tuesday. Pt states he whole head is throbbing. Pt does take migraine meds with no relief.

## 2024-05-12 DIAGNOSIS — Z719 Counseling, unspecified: Secondary | ICD-10-CM

## 2024-05-13 ENCOUNTER — Encounter: Payer: Self-pay | Admitting: Surgical

## 2024-05-13 ENCOUNTER — Ambulatory Visit (INDEPENDENT_AMBULATORY_CARE_PROVIDER_SITE_OTHER): Admitting: Surgical

## 2024-05-13 ENCOUNTER — Encounter: Admitting: Surgical

## 2024-05-13 VITALS — BP 136/93 | HR 76 | Ht 61.0 in | Wt 125.0 lb

## 2024-05-13 DIAGNOSIS — N62 Hypertrophy of breast: Secondary | ICD-10-CM | POA: Diagnosis not present

## 2024-05-13 DIAGNOSIS — M542 Cervicalgia: Secondary | ICD-10-CM

## 2024-05-13 DIAGNOSIS — Z719 Counseling, unspecified: Secondary | ICD-10-CM

## 2024-05-13 MED ORDER — OXYCODONE HCL 5 MG PO TABS: 5.0000 mg | ORAL_TABLET | Freq: Four times a day (QID) | ORAL | 0 refills | Status: AC | PRN

## 2024-05-13 MED ORDER — ONDANSETRON HCL 4 MG PO TABS: 4.0000 mg | ORAL_TABLET | Freq: Three times a day (TID) | ORAL | 0 refills | Status: AC | PRN

## 2024-05-13 NOTE — Progress Notes (Signed)
 Patient ID: Wendy Phillips, female    DOB: 01-02-1981, 43 y.o.   MRN: [id number]  Chief Complaint  Patient presents with   preop    Breast red      ICD-10-CM   1. Macromastia  N62     2. Cervicalgia  M54.2       History of Present Illness: Wendy Phillips is a 43 y.o.  female  with a history of macromastia.  She presents for preoperative evaluation for upcoming procedure, Bilateral Breast Reduction with liposuction, scheduled for 05/31/24 with Dr. Montorfano  The patient has not had problems with anesthesia. No history of DVT/PE.  No family history of DVT/PE.  No family or personal history of bleeding or clotting disorders.  Patient is not currently taking any blood thinners.  No history of CVA/MI.  Patient does not have any history of Crohn's or ulcerative colitis.  She does not have any history of COPD or asthma.  No history of cancer.  No varicose veins or swelling in her legs.  She is not on any hormone medication.  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  Summary of Previous Visit: Patient is a triple D cup and would like to be a B-C cup.  STN 26 cm on the right, 27 on the left.  Estimated excess breast tissue to be removed at time of surgery: 270 grams  Job: Works at [employer], required to lift and repetitive motion, will plan for 6 weeks out of work.  PMH Significant for: GERD, migraines, htn, bcc, gastric bypass 2021.   Patient was recently seen in emergency room on 04/23/2024 for throbbing headache for 3 days.  Physical exam was reassuring.  CT head and neck without any acute findings.  Patient was prescribed amlodipine, Excedrin, meloxicam .  Patient reports she is feeling well.  She does not have any recent changes to her health with the exception of her emergency room visit for her migraine about 3 weeks ago.  Past Medical History: Allergies: No Known Allergies  Current Medications:  Current Outpatient Medications:    dimenhyDRINATE (DRAMAMINE)  50 MG tablet, Take 50 mg by mouth daily as needed for nausea., Disp: , Rfl:    ondansetron (ZOFRAN) 4 MG tablet, Take 1 tablet (4 mg total) by mouth every 8 (eight) hours as needed for nausea or vomiting., Disp: 20 tablet, Rfl: 0   oxyCODONE (OXY IR/ROXICODONE) 5 MG immediate release tablet, Take 1 tablet (5 mg total) by mouth every 6 (six) hours as needed for up to 5 days for severe pain (pain score 7-10)., Disp: 20 tablet, Rfl: 0   rizatriptan (MAXALT-MLT) 10 MG disintegrating tablet, Take 10 mg by mouth as needed., Disp: , Rfl:    traZODone (DESYREL) 100 MG tablet, Take 100 mg by mouth at bedtime., Disp: , Rfl:   Past Medical Problems: Past Medical History:  Diagnosis Date   Basal cell carcinoma 11/30/2020   R upper back - ED&C    Basal cell carcinoma 08/15/2021   Left Scapula, treated with EDC   Dysplastic nevus 08/2019   left low back paraspinal above sacral, left upper back 3.0 cm lat to spine    Past Surgical History: Past Surgical History:  Procedure Laterality Date   ABDOMINAL HYSTERECTOMY  2013   GASTRIC BYPASS  2021    Social History: Social History   Socioeconomic History   Marital status: Single    Spouse name: Not on file  Number of children: Not on file   Years of education: Not on file   Highest education level: Not on file  Occupational History   Not on file  Tobacco Use   Smoking status: Never   Smokeless tobacco: Never  Substance and Sexual Activity   Alcohol use: Not Currently   Drug use: Never   Sexual activity: Yes  Other Topics Concern   Not on file  Social History Narrative   Not on file   Social Drivers of Health   Financial Resource Strain: Low Risk  (03/03/2024)   Received from [facility]   Overall Financial Resource Strain (CARDIA)    Difficulty of Paying Living Expenses: Not hard at all  Food Insecurity: No Food Insecurity (03/03/2024)   Received from [facility]   Hunger Vital Sign    Within the  past 12 months, you worried that your food would run out before you got the money to buy more.: Never true    Within the past 12 months, the food you bought just didn't last and you didn't have money to get more.: Never true  Transportation Needs: No Transportation Needs (03/03/2024)   Received from [facility] - Transportation    In the past 12 months, has lack of transportation kept you from medical appointments or from getting medications?: No    Lack of Transportation (Non-Medical): No  Physical Activity: Not on file  Stress: Not on file  Social Connections: Not on file  Intimate Partner Violence: Not on file    Family History: Family History  Problem Relation Age of Onset   Melanoma Mother    Hypertension Father    Hyperlipidemia Father    Multiple sclerosis Sister    Breast cancer Neg Hx     Review of Systems: Review of Systems  Constitutional: Negative.   Respiratory: Negative.    Cardiovascular: Negative.   Gastrointestinal: Negative.   Neurological: Negative.     Physical Exam: Vital Signs BP (!) 136/93   Pulse 76   Ht 5' 1 (1.549 m)   Wt 125 lb (56.7 kg)   SpO2 99%   BMI 23.62 kg/m   Physical Exam Constitutional:      General: Not in acute distress.    Appearance: Normal appearance. Not ill-appearing.  HENT:     Head: Normocephalic and atraumatic.  Eyes:     Pupils: Pupils are equal, round Neck:     Musculoskeletal: Normal range of motion.  Cardiovascular:     Rate and Rhythm: Normal rate    Pulses: Normal pulses.  Pulmonary:     Effort: Pulmonary effort is normal. No respiratory distress.  Musculoskeletal: Normal range of motion.  Skin:    General: Skin is warm and dry.     Findings: No erythema or rash.  Neurological:     General: No focal deficit present.     Mental Status: Alert and oriented to person, place, and time. Mental status is at baseline.     Motor: No weakness.  Psychiatric:        Mood and Affect:  Mood normal.        Behavior: Behavior normal.    Assessment/Plan: The patient is scheduled for bilateral breast reduction with Dr. Montorfano.  Risks, benefits, and alternatives of procedure discussed, questions answered and consent obtained.    Smoking Status: Non-smoker; Counseling Given?  N/A Last Mammogram: 08/18/2023; Results: BI-RADS 1 negative  Caprini Score: 4,  moderate; Risk Factors include: Age, history of 3 miscarriages, and length of planned surgery. Recommendation for mechanical prophylaxis. Encourage early ambulation.   Pictures obtained: @consult   Post-op Rx sent to pharmacy: Oxycodone, Zofran  Patient was provided with the breast reduction and General Surgical Risk consent document and Pain Medication Agreement prior to their appointment.  They had adequate time to read through the risk consent documents and Pain Medication Agreement. We also discussed them in person together during this preop appointment. All of their questions were answered to their satisfaction.  Recommended calling if they have any further questions.  Risk consent form and Pain Medication Agreement to be scanned into patient's chart.  The risk that can be encountered with breast reduction were discussed and include the following but not limited to these:  Breast asymmetry, fluid accumulation, firmness of the breast, inability to breast feed, loss of nipple or areola, skin loss, decrease or no nipple sensation, fat necrosis of the breast tissue, bleeding, infection, healing delay.  There are risks of anesthesia, changes to skin sensation and injury to nerves or blood vessels.  The muscle can be temporarily or permanently injured.  You may have an allergic reaction to tape, suture, glue, blood products which can result in skin discoloration, swelling, pain, skin lesions, poor healing.  Any of these can lead to the need for revisonal surgery or stage procedures.  A reduction has potential to interfere with  diagnostic procedures.  Nipple or breast piercing can increase risks of infection.  This procedure is best done when the breast is fully developed.  Changes in the breast will continue to occur over time.  Pregnancy can alter the outcomes of previous breast reduction surgery, weight gain and weigh loss can also effect the long term appearance.     Electronically signed by: Donnice PARAS Wendy Gange, PA-C 05/13/2024 2:56 PM

## 2024-05-13 NOTE — H&P (View-Only) (Signed)
 Patient ID: Wendy Phillips, female    DOB: 01-02-1981, 43 y.o.   MRN: [id number]  Chief Complaint  Patient presents with   preop    Breast red      ICD-10-CM   1. Macromastia  N62     2. Cervicalgia  M54.2       History of Present Illness: Wendy Phillips is a 43 y.o.  female  with a history of macromastia.  She presents for preoperative evaluation for upcoming procedure, Bilateral Breast Reduction with liposuction, scheduled for 05/31/24 with Dr. Montorfano  The patient has not had problems with anesthesia. No history of DVT/PE.  No family history of DVT/PE.  No family or personal history of bleeding or clotting disorders.  Patient is not currently taking any blood thinners.  No history of CVA/MI.  Patient does not have any history of Crohn's or ulcerative colitis.  She does not have any history of COPD or asthma.  No history of cancer.  No varicose veins or swelling in her legs.  She is not on any hormone medication.  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  Summary of Previous Visit: Patient is a triple D cup and would like to be a B-C cup.  STN 26 cm on the right, 27 on the left.  Estimated excess breast tissue to be removed at time of surgery: 270 grams  Job: Works at [employer], required to lift and repetitive motion, will plan for 6 weeks out of work.  PMH Significant for: GERD, migraines, htn, bcc, gastric bypass 2021.   Patient was recently seen in emergency room on 04/23/2024 for throbbing headache for 3 days.  Physical exam was reassuring.  CT head and neck without any acute findings.  Patient was prescribed amlodipine, Excedrin, meloxicam .  Patient reports she is feeling well.  She does not have any recent changes to her health with the exception of her emergency room visit for her migraine about 3 weeks ago.  Past Medical History: Allergies: No Known Allergies  Current Medications:  Current Outpatient Medications:    dimenhyDRINATE (DRAMAMINE)  50 MG tablet, Take 50 mg by mouth daily as needed for nausea., Disp: , Rfl:    ondansetron (ZOFRAN) 4 MG tablet, Take 1 tablet (4 mg total) by mouth every 8 (eight) hours as needed for nausea or vomiting., Disp: 20 tablet, Rfl: 0   oxyCODONE (OXY IR/ROXICODONE) 5 MG immediate release tablet, Take 1 tablet (5 mg total) by mouth every 6 (six) hours as needed for up to 5 days for severe pain (pain score 7-10)., Disp: 20 tablet, Rfl: 0   rizatriptan (MAXALT-MLT) 10 MG disintegrating tablet, Take 10 mg by mouth as needed., Disp: , Rfl:    traZODone (DESYREL) 100 MG tablet, Take 100 mg by mouth at bedtime., Disp: , Rfl:   Past Medical Problems: Past Medical History:  Diagnosis Date   Basal cell carcinoma 11/30/2020   R upper back - ED&C    Basal cell carcinoma 08/15/2021   Left Scapula, treated with EDC   Dysplastic nevus 08/2019   left low back paraspinal above sacral, left upper back 3.0 cm lat to spine    Past Surgical History: Past Surgical History:  Procedure Laterality Date   ABDOMINAL HYSTERECTOMY  2013   GASTRIC BYPASS  2021    Social History: Social History   Socioeconomic History   Marital status: Single    Spouse name: Not on file  Number of children: Not on file   Years of education: Not on file   Highest education level: Not on file  Occupational History   Not on file  Tobacco Use   Smoking status: Never   Smokeless tobacco: Never  Substance and Sexual Activity   Alcohol use: Not Currently   Drug use: Never   Sexual activity: Yes  Other Topics Concern   Not on file  Social History Narrative   Not on file   Social Drivers of Health   Financial Resource Strain: Low Risk  (03/03/2024)   Received from [facility]   Overall Financial Resource Strain (CARDIA)    Difficulty of Paying Living Expenses: Not hard at all  Food Insecurity: No Food Insecurity (03/03/2024)   Received from [facility]   Hunger Vital Sign    Within the  past 12 months, you worried that your food would run out before you got the money to buy more.: Never true    Within the past 12 months, the food you bought just didn't last and you didn't have money to get more.: Never true  Transportation Needs: No Transportation Needs (03/03/2024)   Received from [facility] - Transportation    In the past 12 months, has lack of transportation kept you from medical appointments or from getting medications?: No    Lack of Transportation (Non-Medical): No  Physical Activity: Not on file  Stress: Not on file  Social Connections: Not on file  Intimate Partner Violence: Not on file    Family History: Family History  Problem Relation Age of Onset   Melanoma Mother    Hypertension Father    Hyperlipidemia Father    Multiple sclerosis Sister    Breast cancer Neg Hx     Review of Systems: Review of Systems  Constitutional: Negative.   Respiratory: Negative.    Cardiovascular: Negative.   Gastrointestinal: Negative.   Neurological: Negative.     Physical Exam: Vital Signs BP (!) 136/93   Pulse 76   Ht 5' 1 (1.549 m)   Wt 125 lb (56.7 kg)   SpO2 99%   BMI 23.62 kg/m   Physical Exam Constitutional:      General: Not in acute distress.    Appearance: Normal appearance. Not ill-appearing.  HENT:     Head: Normocephalic and atraumatic.  Eyes:     Pupils: Pupils are equal, round Neck:     Musculoskeletal: Normal range of motion.  Cardiovascular:     Rate and Rhythm: Normal rate    Pulses: Normal pulses.  Pulmonary:     Effort: Pulmonary effort is normal. No respiratory distress.  Musculoskeletal: Normal range of motion.  Skin:    General: Skin is warm and dry.     Findings: No erythema or rash.  Neurological:     General: No focal deficit present.     Mental Status: Alert and oriented to person, place, and time. Mental status is at baseline.     Motor: No weakness.  Psychiatric:        Mood and Affect:  Mood normal.        Behavior: Behavior normal.    Assessment/Plan: The patient is scheduled for bilateral breast reduction with Dr. Montorfano.  Risks, benefits, and alternatives of procedure discussed, questions answered and consent obtained.    Smoking Status: Non-smoker; Counseling Given?  N/A Last Mammogram: 08/18/2023; Results: BI-RADS 1 negative  Caprini Score: 4,  moderate; Risk Factors include: Age, history of 3 miscarriages, and length of planned surgery. Recommendation for mechanical prophylaxis. Encourage early ambulation.   Pictures obtained: @consult   Post-op Rx sent to pharmacy: Oxycodone, Zofran  Patient was provided with the breast reduction and General Surgical Risk consent document and Pain Medication Agreement prior to their appointment.  They had adequate time to read through the risk consent documents and Pain Medication Agreement. We also discussed them in person together during this preop appointment. All of their questions were answered to their satisfaction.  Recommended calling if they have any further questions.  Risk consent form and Pain Medication Agreement to be scanned into patient's chart.  The risk that can be encountered with breast reduction were discussed and include the following but not limited to these:  Breast asymmetry, fluid accumulation, firmness of the breast, inability to breast feed, loss of nipple or areola, skin loss, decrease or no nipple sensation, fat necrosis of the breast tissue, bleeding, infection, healing delay.  There are risks of anesthesia, changes to skin sensation and injury to nerves or blood vessels.  The muscle can be temporarily or permanently injured.  You may have an allergic reaction to tape, suture, glue, blood products which can result in skin discoloration, swelling, pain, skin lesions, poor healing.  Any of these can lead to the need for revisonal surgery or stage procedures.  A reduction has potential to interfere with  diagnostic procedures.  Nipple or breast piercing can increase risks of infection.  This procedure is best done when the breast is fully developed.  Changes in the breast will continue to occur over time.  Pregnancy can alter the outcomes of previous breast reduction surgery, weight gain and weigh loss can also effect the long term appearance.     Electronically signed by: Donnice PARAS Lennyn Gange, PA-C 05/13/2024 2:56 PM

## 2024-05-14 ENCOUNTER — Telehealth: Payer: Self-pay

## 2024-05-14 NOTE — Telephone Encounter (Signed)
 Please advise. Last office visit was to establish care with Dr.Rucker on 08/12/2022 and has not returned to our office since. There is a form scanned into her chart for surgical clearance dated 04/29/2024.

## 2024-05-14 NOTE — Telephone Encounter (Signed)
 Copied from CRM [id number]. Topic: Clinical - Medical Advice >> May 14, 2024  3:43 PM Amy B wrote: Reason for CRM: Ronal Gift with Plastic Surgery Department at [facility] states they still have not received medical clearance for the patient to have surgery.  Surgery is scheduled 11/24.  Please fax to Ronal Gift at [phone]

## 2024-05-14 NOTE — Telephone Encounter (Signed)
 I have faxed this office the surgical clearance from twice and contacted the office twice by phone. She will send another request to the PCP regarding surgical clearance. They do have the surgical date of 05/31/24 on record.

## 2024-05-17 ENCOUNTER — Telehealth: Payer: Self-pay

## 2024-05-17 NOTE — Telephone Encounter (Signed)
 Wendy Phillips, Wendy Phillips this is pt PCP for the clearance to be faxed to, pt just called, also pt wants to know if the FMLA paperwork she gave Adina is done, it has been paid

## 2024-05-17 NOTE — Telephone Encounter (Signed)
 Patient is aware and she has established with another PCP.

## 2024-05-18 ENCOUNTER — Telehealth: Payer: Self-pay

## 2024-05-18 ENCOUNTER — Telehealth: Payer: Self-pay | Admitting: Student

## 2024-05-18 NOTE — Telephone Encounter (Signed)
 CONROY, NATHAN this is pt PCP for the clearance to be faxed   Attn Leeroy  Pt stated it still had no been faxed for the sx clearance, she see her PCP tomorrow and pt sx is 05-31-24

## 2024-05-18 NOTE — Telephone Encounter (Signed)
 Faxed surgical clearance to Leeroy in the office of Rankin Dike, PA-C with confirmed receipt.

## 2024-05-20 NOTE — Telephone Encounter (Signed)
 The office called back and said they still havent received the fax, I did confirm twice that the fax number was correct. Please refax to [phone] Wendy Phillips.

## 2024-05-28 ENCOUNTER — Other Ambulatory Visit: Payer: Self-pay

## 2024-05-28 ENCOUNTER — Encounter (HOSPITAL_COMMUNITY): Payer: Self-pay

## 2024-05-28 NOTE — Progress Notes (Signed)
 SDW call  Patient was given pre-op instructions over the phone. Patient verbalized understanding of instructions provided. She denied any SOB, fever or cough.     PCP - Rankin Dike, PA-C Cardiologist -  Pulmonary:    PPM/ICD - denies Device Orders - na Rep Notified - na   Chest x-ray - 09/04/2023 EKG -  09/04/2023 Stress Test -01/19/2020 ECHO -  Cardiac Cath -   Sleep Study/sleep apnea/CPAP: denies  Non-diabetic  GLP1: Zepbound, states has not used it in over a month  Blood Thinner Instructions: denies Aspirin  Instructions:denies   ERAS Protcol - Clears until 1000  Anesthesia review: No  Your procedure is scheduled on Monday May 31, 2024  Report to [facility] Main Entrance A at  1030  A.M., then check in with the Admitting office.  Call this number if you have problems the morning of surgery:  [phone]   If you have any questions prior to your surgery date call [phone]: Open Monday-Friday 8am-4pm If you experience any cold or flu symptoms such as cough, fever, chills, shortness of breath, etc. between now and your scheduled surgery, please notify us  at the above number    Remember:  Do not eat after midnight the night before your surgery  You may drink clear liquids until  1000   the morning of your surgery.   Clear liquids allowed are: Water, Non-Citrus Juices (without pulp), Carbonated Beverages, Clear Tea, Black Coffee ONLY (NO MILK, CREAM OR POWDERED CREAMER of any kind), and Gatorade   Take these medicines if needed the morning of surgery with A SIP OF WATER:  Zofran , maxalt, clonazepam  As of today, STOP taking any Aspirin  (unless otherwise instructed by your surgeon) Aleve, Naproxen, Ibuprofen, Motrin, Advil, Goody's, BC's, all herbal medications, fish oil, and all vitamins.

## 2024-05-31 ENCOUNTER — Ambulatory Visit (HOSPITAL_COMMUNITY): Admission: RE | Admit: 2024-05-31 | Discharge: 2024-05-31 | Disposition: A | Discharge status: Home / Self Care

## 2024-05-31 ENCOUNTER — Other Ambulatory Visit: Payer: Self-pay

## 2024-05-31 ENCOUNTER — Encounter (HOSPITAL_COMMUNITY): Admission: RE | Disposition: A | Discharge status: Home / Self Care | Payer: Self-pay | Source: Home / Self Care

## 2024-05-31 ENCOUNTER — Ambulatory Visit (HOSPITAL_COMMUNITY): Admitting: Anesthesiology

## 2024-05-31 ENCOUNTER — Encounter (HOSPITAL_COMMUNITY): Payer: Self-pay

## 2024-05-31 DIAGNOSIS — G43909 Migraine, unspecified, not intractable, without status migrainosus: Secondary | ICD-10-CM | POA: Insufficient documentation

## 2024-05-31 DIAGNOSIS — M1990 Unspecified osteoarthritis, unspecified site: Secondary | ICD-10-CM | POA: Diagnosis not present

## 2024-05-31 DIAGNOSIS — I10 Essential (primary) hypertension: Secondary | ICD-10-CM | POA: Insufficient documentation

## 2024-05-31 DIAGNOSIS — Z9884 Bariatric surgery status: Secondary | ICD-10-CM | POA: Insufficient documentation

## 2024-05-31 DIAGNOSIS — N62 Hypertrophy of breast: Secondary | ICD-10-CM | POA: Diagnosis present

## 2024-05-31 DIAGNOSIS — K219 Gastro-esophageal reflux disease without esophagitis: Secondary | ICD-10-CM | POA: Diagnosis not present

## 2024-05-31 DIAGNOSIS — Z79899 Other long term (current) drug therapy: Secondary | ICD-10-CM | POA: Diagnosis not present

## 2024-05-31 DIAGNOSIS — F419 Anxiety disorder, unspecified: Secondary | ICD-10-CM | POA: Diagnosis not present

## 2024-05-31 DIAGNOSIS — F32A Depression, unspecified: Secondary | ICD-10-CM | POA: Diagnosis not present

## 2024-05-31 HISTORY — PX: BREAST REDUCTION SURGERY: SHX8

## 2024-05-31 HISTORY — DX: Essential (primary) hypertension: I10

## 2024-05-31 HISTORY — DX: Other complications of anesthesia, initial encounter: T88.59XA

## 2024-05-31 HISTORY — DX: Anxiety disorder, unspecified: F41.9

## 2024-05-31 HISTORY — DX: Unspecified osteoarthritis, unspecified site: M19.90

## 2024-05-31 HISTORY — DX: Headache, unspecified: R51.9

## 2024-05-31 HISTORY — DX: Depression, unspecified: F32.A

## 2024-05-31 LAB — BASIC METABOLIC PANEL WITH GFR
Anion gap: 8 (ref 5–15)
BUN: 8 mg/dL (ref 6–20)
CO2: 27 mmol/L (ref 22–32)
Calcium: 8.6 mg/dL — ABNORMAL LOW (ref 8.9–10.3)
Chloride: 105 mmol/L (ref 98–111)
Creatinine, Ser: 0.58 mg/dL (ref 0.44–1.00)
GFR, Estimated: >60 mL/min (ref >60)
Glucose, Bld: 99 mg/dL (ref 70–99)
Potassium: 4 mmol/L (ref 3.5–5.1)
Sodium: 140 mmol/L (ref 135–145)

## 2024-05-31 LAB — CBC
HCT: 30.8 % — ABNORMAL LOW (ref 36.0–46.0)
Hemoglobin: 10.1 g/dL — ABNORMAL LOW (ref 12.0–15.0)
MCH: 30.9 pg (ref 26.0–34.0)
MCHC: 32.8 g/dL (ref 30.0–36.0)
MCV: 94.2 fL (ref 80.0–100.0)
Platelets: 142 K/uL — ABNORMAL LOW (ref 150–400)
RBC: 3.27 MIL/uL — ABNORMAL LOW (ref 3.87–5.11)
RDW: 12.7 % (ref 11.5–15.5)
WBC: 3.6 K/uL — ABNORMAL LOW (ref 4.0–10.5)
nRBC: 0 % (ref 0.0–0.2)

## 2024-05-31 SURGERY — BREAST REDUCTION WITH LIPOSUCTION
Anesthesia: General | Site: Breast | Laterality: Bilateral

## 2024-05-31 MED ORDER — KETAMINE HCL 10 MG/ML IJ SOLN
INTRAMUSCULAR | Status: DC | PRN
  Administered 2024-05-31 (×2): 20 mg via INTRAVENOUS
  Administered 2024-05-31: 10 mg via INTRAVENOUS

## 2024-05-31 MED ORDER — GABAPENTIN 300 MG PO CAPS
300.0000 mg | ORAL_CAPSULE | ORAL | Status: AC
  Administered 2024-05-31: 300 mg via ORAL
  Filled 2024-05-31: qty 1

## 2024-05-31 MED ORDER — SUGAMMADEX SODIUM 200 MG/2ML IV SOLN
INTRAVENOUS | Status: DC | PRN
  Administered 2024-05-31: 200 mg via INTRAVENOUS

## 2024-05-31 MED ORDER — SCOPOLAMINE 1 MG/3DAYS TD PT72
1.0000 | MEDICATED_PATCH | Freq: Once | TRANSDERMAL | Status: DC
  Administered 2024-05-31: 1 mg via TRANSDERMAL
  Filled 2024-05-31: qty 1

## 2024-05-31 MED ORDER — AMISULPRIDE (ANTIEMETIC) 5 MG/2ML IV SOLN
10.0000 mg | Freq: Once | INTRAVENOUS | Status: AC | PRN
  Administered 2024-05-31: 10 mg via INTRAVENOUS

## 2024-05-31 MED ORDER — HYDROGEN PEROXIDE 3 % EX SOLN
CUTANEOUS | Status: DC | PRN
  Administered 2024-05-31: 1

## 2024-05-31 MED ORDER — HYDROMORPHONE HCL 1 MG/ML IJ SOLN
INTRAMUSCULAR | Status: AC
  Filled 2024-05-31: qty 1

## 2024-05-31 MED ORDER — CHLORHEXIDINE GLUCONATE CLOTH 2 % EX PADS: 6.0000 | MEDICATED_PAD | Freq: Once | CUTANEOUS | Status: DC

## 2024-05-31 MED ORDER — OXYCODONE HCL 5 MG/5ML PO SOLN
5.0000 mg | Freq: Once | ORAL | Status: AC | PRN
  Administered 2024-05-31: 5 mg via ORAL

## 2024-05-31 MED ORDER — ACETAMINOPHEN 500 MG PO TABS: 1000.0000 mg | ORAL_TABLET | Freq: Once | ORAL | Status: DC

## 2024-05-31 MED ORDER — HYDROMORPHONE HCL 1 MG/ML IJ SOLN
0.2500 mg | INTRAMUSCULAR | Status: DC | PRN
  Administered 2024-05-31 (×4): 0.5 mg via INTRAVENOUS

## 2024-05-31 MED ORDER — MIDAZOLAM HCL 2 MG/2ML IJ SOLN
INTRAMUSCULAR | Status: AC
  Filled 2024-05-31: qty 2

## 2024-05-31 MED ORDER — EPHEDRINE SULFATE-NACL 50-0.9 MG/10ML-% IV SOSY: PREFILLED_SYRINGE | INTRAVENOUS | Status: DC | PRN

## 2024-05-31 MED ORDER — KETAMINE HCL 50 MG/5ML IJ SOSY
PREFILLED_SYRINGE | INTRAMUSCULAR | Status: AC
  Filled 2024-05-31: qty 5

## 2024-05-31 MED ORDER — ONDANSETRON HCL 4 MG/2ML IJ SOLN
INTRAMUSCULAR | Status: DC | PRN
Start: 2024-05-31 — End: 2024-05-31
  Administered 2024-05-31: 4 mg via INTRAVENOUS

## 2024-05-31 MED ORDER — FENTANYL CITRATE (PF) 250 MCG/5ML IJ SOLN
INTRAMUSCULAR | Status: AC
  Filled 2024-05-31: qty 5

## 2024-05-31 MED ORDER — PHENYLEPHRINE 80 MCG/ML (10ML) SYRINGE FOR IV PUSH (FOR BLOOD PRESSURE SUPPORT)
PREFILLED_SYRINGE | INTRAVENOUS | Status: DC | PRN
  Administered 2024-05-31 (×2): 80 ug via INTRAVENOUS
  Administered 2024-05-31: 160 ug via INTRAVENOUS

## 2024-05-31 MED ORDER — AMISULPRIDE (ANTIEMETIC) 5 MG/2ML IV SOLN
INTRAVENOUS | Status: AC
  Filled 2024-05-31: qty 4

## 2024-05-31 MED ORDER — PROPOFOL 10 MG/ML IV BOLUS
INTRAVENOUS | Status: DC | PRN
  Administered 2024-05-31: 140 mg via INTRAVENOUS

## 2024-05-31 MED ORDER — ALBUMIN HUMAN 5 % IV SOLN: INTRAVENOUS | Status: DC | PRN

## 2024-05-31 MED ORDER — MIDAZOLAM HCL (PF) 2 MG/2ML IJ SOLN
INTRAMUSCULAR | Status: DC | PRN
  Administered 2024-05-31: 2 mg via INTRAVENOUS

## 2024-05-31 MED ORDER — FENTANYL CITRATE (PF) 250 MCG/5ML IJ SOLN
INTRAMUSCULAR | Status: DC | PRN
  Administered 2024-05-31 (×2): 50 ug via INTRAVENOUS
  Administered 2024-05-31 (×2): 75 ug via INTRAVENOUS

## 2024-05-31 MED ORDER — LACTATED RINGERS IV SOLN: INTRAVENOUS | Status: DC | PRN

## 2024-05-31 MED ORDER — HYDROMORPHONE HCL 1 MG/ML IJ SOLN
0.2500 mg | INTRAMUSCULAR | Status: DC | PRN
  Administered 2024-05-31 (×2): 0.5 mg via INTRAVENOUS

## 2024-05-31 MED ORDER — ACETAMINOPHEN 500 MG PO TABS: 1000.0000 mg | ORAL_TABLET | ORAL | Status: DC

## 2024-05-31 MED ORDER — ONDANSETRON HCL 4 MG/2ML IJ SOLN: 4.0000 mg | Freq: Once | INTRAMUSCULAR | Status: DC | PRN

## 2024-05-31 MED ORDER — CEFAZOLIN SODIUM-DEXTROSE 2-4 GM/100ML-% IV SOLN
2.0000 g | INTRAVENOUS | Status: AC
  Administered 2024-05-31: 2 g via INTRAVENOUS
  Filled 2024-05-31: qty 100

## 2024-05-31 MED ORDER — OXYCODONE HCL 5 MG PO TABS: 5.0000 mg | ORAL_TABLET | Freq: Once | ORAL | Status: AC | PRN

## 2024-05-31 MED ORDER — LIDOCAINE HCL 1 % IJ SOLN
Freq: Once | INTRAVENOUS | Status: AC
  Administered 2024-05-31: 1000 mL via INTRAMUSCULAR
  Filled 2024-05-31: qty 50

## 2024-05-31 MED ORDER — ORAL CARE MOUTH RINSE: 15.0000 mL | Freq: Once | OROMUCOSAL | Status: AC

## 2024-05-31 MED ORDER — BUPIVACAINE HCL (PF) 0.25 % IJ SOLN
INTRAMUSCULAR | Status: DC | PRN
  Administered 2024-05-31 (×2): 10 mL

## 2024-05-31 MED ORDER — LIDOCAINE 2% (20 MG/ML) 5 ML SYRINGE
INTRAMUSCULAR | Status: DC | PRN
  Administered 2024-05-31: 40 mg via INTRAVENOUS

## 2024-05-31 MED ORDER — 0.9 % SODIUM CHLORIDE (POUR BTL) OPTIME
TOPICAL | Status: DC | PRN
  Administered 2024-05-31: 1000 mL

## 2024-05-31 MED ORDER — VASHE WOUND IRRIGATION OPTIME
TOPICAL | Status: DC | PRN
  Administered 2024-05-31: 34 [oz_av] via TOPICAL

## 2024-05-31 MED ORDER — BUPIVACAINE HCL (PF) 0.25 % IJ SOLN
INTRAMUSCULAR | Status: AC
  Filled 2024-05-31: qty 30

## 2024-05-31 MED ORDER — LACTATED RINGERS IV SOLN: INTRAVENOUS | Status: DC

## 2024-05-31 MED ORDER — BACITRACIN ZINC 500 UNIT/GM EX OINT
TOPICAL_OINTMENT | CUTANEOUS | Status: AC
  Filled 2024-05-31: qty 28.35

## 2024-05-31 MED ORDER — DEXAMETHASONE SOD PHOSPHATE PF 10 MG/ML IJ SOLN
INTRAMUSCULAR | Status: DC | PRN
  Administered 2024-05-31: 10 mg via INTRAVENOUS

## 2024-05-31 MED ORDER — OXYCODONE HCL 5 MG/5ML PO SOLN
ORAL | Status: AC
  Filled 2024-05-31: qty 5

## 2024-05-31 MED ORDER — PHENYLEPHRINE HCL-NACL 20-0.9 MG/250ML-% IV SOLN
INTRAVENOUS | Status: DC | PRN
  Administered 2024-05-31: 25 ug/min via INTRAVENOUS

## 2024-05-31 MED ORDER — ROCURONIUM BROMIDE 10 MG/ML (PF) SYRINGE
PREFILLED_SYRINGE | INTRAVENOUS | Status: DC | PRN
  Administered 2024-05-31: 50 mg via INTRAVENOUS

## 2024-05-31 MED ORDER — EPHEDRINE SULFATE (PRESSORS) 25 MG/5ML IV SOSY
PREFILLED_SYRINGE | INTRAVENOUS | Status: DC | PRN
  Administered 2024-05-31: 5 mg via INTRAVENOUS

## 2024-05-31 MED ORDER — CHLORHEXIDINE GLUCONATE 0.12 % MT SOLN
15.0000 mL | Freq: Once | OROMUCOSAL | Status: AC
  Administered 2024-05-31: 15 mL via OROMUCOSAL
  Filled 2024-05-31: qty 15

## 2024-05-31 SURGICAL SUPPLY — 61 items
BINDER BREAST LRG (GAUZE/BANDAGES/DRESSINGS) IMPLANT
BIOPATCH RED 1 DISK 7.0 (GAUZE/BANDAGES/DRESSINGS) IMPLANT
BLADE SURG 10 STRL SS (BLADE) ×2 IMPLANT
BNDG GAUZE DERMACEA FLUFF 4 (GAUZE/BANDAGES/DRESSINGS) IMPLANT
CANISTER LIPO FAT HARVEST (MISCELLANEOUS) ×1 IMPLANT
CANISTER SUCTION 3000ML PPV (SUCTIONS) ×1 IMPLANT
CHLORAPREP W/TINT 26 (MISCELLANEOUS) ×1 IMPLANT
CLEANSER WND VASHE 34 (WOUND CARE) ×1 IMPLANT
DERMABOND ADVANCED .7 DNX12 (GAUZE/BANDAGES/DRESSINGS) IMPLANT
DRAIN CHANNEL 15F RND FF W/TCR (WOUND CARE) IMPLANT
DRAIN CHANNEL 19F RND (DRAIN) IMPLANT
DRAPE HALF SHEET 40X57 (DRAPES) ×2 IMPLANT
DRAPE UTILITY XL STRL (DRAPES) ×1 IMPLANT
DRSG MEPILEX POST OP 4X8 (GAUZE/BANDAGES/DRESSINGS) ×2 IMPLANT
DRSG TEGADERM 4X4.75 (GAUZE/BANDAGES/DRESSINGS) ×2 IMPLANT
DRSG TELFA 3X8 NADH STRL (GAUZE/BANDAGES/DRESSINGS) ×1 IMPLANT
ELECT CAUTERY BLADE 6.4 (BLADE) ×1 IMPLANT
ELECT COATED BLADE 2.86 ST (ELECTRODE) IMPLANT
ELECTRODE BLDE 4.0 EZ CLN MEGD (MISCELLANEOUS) ×1 IMPLANT
ELECTRODE REM PT RTRN 9FT ADLT (ELECTROSURGICAL) ×1 IMPLANT
EVACUATOR SILICONE 100CC (DRAIN) IMPLANT
GAUZE PAD ABD 8X10 STRL (GAUZE/BANDAGES/DRESSINGS) ×2 IMPLANT
GAUZE XEROFORM 5X9 LF (GAUZE/BANDAGES/DRESSINGS) IMPLANT
GLOVE BIO SURGEON STRL SZ7.5 (GLOVE) ×1 IMPLANT
GLOVE BIO SURGEON STRL SZ8 (GLOVE) IMPLANT
GLOVE BIOGEL PI IND STRL 8 (GLOVE) ×1 IMPLANT
GOWN STRL REUS W/ TWL LRG LVL3 (GOWN DISPOSABLE) ×2 IMPLANT
GOWN STRL REUS W/TWL XL LVL3 (GOWN DISPOSABLE) ×1 IMPLANT
HYDROGEN PEROXIDE 16OZ (MISCELLANEOUS) ×1 IMPLANT
LINER CANISTER 1000CC FLEX (MISCELLANEOUS) ×1 IMPLANT
MARKER SKIN DUAL TIP RULER LAB (MISCELLANEOUS) ×1 IMPLANT
NDL HYPO 22X1.5 SAFETY MO (MISCELLANEOUS) ×1 IMPLANT
PACK GENERAL/GYN (CUSTOM PROCEDURE TRAY) ×1 IMPLANT
PACK UNIVERSAL I (CUSTOM PROCEDURE TRAY) ×1 IMPLANT
PAD ABD 8X10 STRL (GAUZE/BANDAGES/DRESSINGS) IMPLANT
PAD FOAM SILICONE BACKED (GAUZE/BANDAGES/DRESSINGS) IMPLANT
PIN SAFETY STERILE (MISCELLANEOUS) IMPLANT
POWDER MYRIAD MORCLLS FINE 500 (Miscellaneous) IMPLANT
POWDER SURGICEL 3.0 GRAM (HEMOSTASIS) IMPLANT
SOLN 0.9% NACL POUR BTL 1000ML (IV SOLUTION) ×1 IMPLANT
SPIKE FLUID TRANSFER (MISCELLANEOUS) IMPLANT
SPONGE LAP 18X18 X RAY DECT (DISPOSABLE) IMPLANT
SPONGE T-LAP 18X18 ~~LOC~~+RFID (SPONGE) IMPLANT
STAPLER SKIN PROX 35W (STAPLE) ×1 IMPLANT
STRIP CLOSURE SKIN 1/2X4 (GAUZE/BANDAGES/DRESSINGS) IMPLANT
SUT ETHILON 3 0 PS 1 (SUTURE) ×1 IMPLANT
SUT MON AB 3-0 SH27 (SUTURE) ×2 IMPLANT
SUT MON AB 4-0 PS1 27 (SUTURE) IMPLANT
SUT PDS AB 2-0 CT2 27 (SUTURE) ×2 IMPLANT
SUT PDS AB 3-0 SH 27 (SUTURE) IMPLANT
SUT PLAIN GUT FAST 5-0 (SUTURE) ×1 IMPLANT
SUT VIC AB 0 CT1 18XCR BRD 8 (SUTURE) ×1 IMPLANT
SUT VIC AB 2-0 SH 18 (SUTURE) IMPLANT
SYR 50ML LL SCALE MARK (SYRINGE) IMPLANT
SYR BULB EAR ULCER 3OZ GRN STR (SYRINGE) ×1 IMPLANT
SYR BULB IRRIG 60ML STRL (SYRINGE) ×1 IMPLANT
SYR CONTROL 10ML LL (SYRINGE) ×1 IMPLANT
TOWEL GREEN STERILE FF (TOWEL DISPOSABLE) ×2 IMPLANT
TUBE CONNECTING 20X1/4 (TUBING) ×1 IMPLANT
TUBING INFILTRATION IT-10001 (TUBING) ×1 IMPLANT
TUBING SET GRADUATE ASPIR 12FT (MISCELLANEOUS) ×1 IMPLANT

## 2024-05-31 NOTE — Anesthesia Preprocedure Evaluation (Addendum)
 Anesthesia Evaluation  Patient identified by MRN, date of birth, ID band Patient awake    Reviewed: Allergy & Precautions, NPO status , Patient's Chart, lab work & pertinent test results  Airway Mallampati: II  TM Distance: >3 FB Neck ROM: Full    Dental no notable dental hx. (+) Teeth Intact, Dental Advisory Given   Pulmonary neg pulmonary ROS   Pulmonary exam normal breath sounds clear to auscultation       Cardiovascular hypertension, Normal cardiovascular exam Rhythm:Regular Rate:Normal     Neuro/Psych  Headaches PSYCHIATRIC DISORDERS Anxiety Depression       GI/Hepatic Neg liver ROS,GERD  Controlled,,S/p gastric bypass 2021   Endo/Other  negative endocrine ROS    Renal/GU negative Renal ROS  negative genitourinary   Musculoskeletal  (+) Arthritis ,    Abdominal   Peds  Hematology negative hematology ROS (+)   Anesthesia Other Findings Took tylenol  at home at 9am  Reproductive/Obstetrics negative OB ROS                              Anesthesia Physical Anesthesia Plan  ASA: 2  Anesthesia Plan: General   Post-op Pain Management: Ketamine  IV*, Precedex, Ofirmev  IV (intra-op)* and Toradol  IV (intra-op)*   Induction: Intravenous  PONV Risk Score and Plan: 3 and Ondansetron , Dexamethasone , Midazolam , Treatment may vary due to age or medical condition and Diphenhydramine   Airway Management Planned: Oral ETT  Additional Equipment: None  Intra-op Plan:   Post-operative Plan: Extubation in OR  Informed Consent: I have reviewed the patients History and Physical, chart, labs and discussed the procedure including the risks, benefits and alternatives for the proposed anesthesia with the patient or authorized representative who has indicated his/her understanding and acceptance.     Dental advisory given  Plan Discussed with: CRNA  Anesthesia Plan Comments:           Anesthesia Quick Evaluation

## 2024-05-31 NOTE — Op Note (Signed)
 Operative Note   DATE OF OPERATION: 05/31/2024  LOCATION: [facility]  SURGICAL DEPARTMENT: Plastic Surgery  PREOPERATIVE DIAGNOSES:  Bilateral breast macromastia  POSTOPERATIVE DIAGNOSES:  same  PROCEDURE:  Bilateral Breast Reduction with Liposuction.   SURGEON: Keni Wafer, MD  ASSISTANT: Estefana Peck, PA; Marinell Birmingham M.D.  ANESTHESIA:  General.   COMPLICATIONS: None.   EBL < 50 cc  INDICATIONS FOR PROCEDURE:  The patient, Wendy Phillips is a 43 y.o. female born on 01-13-1981, is here for treatment of bilateral breast macromastia.  MRN: [id number]  CONSENT:  Informed consent was obtained directly from the patient. Risks, benefits and alternatives were fully discussed. Specific risks including but not limited to bleeding, infection, hematoma, seroma, scarring, pain, infection, contracture, asymmetry, wound healing problems, and need for further surgery were all discussed. The patient did have an ample opportunity to have questions answered to satisfaction.    DESCRIPTION OF PROCEDURE:  The patient was draped and prepped in a sterile fashion. Sequential compression devices were placed and activated. Prior to beginning surgery, the entire surgery team took part in a Universal Protocol Time-Out.   Right Breast A 42 mm nipple marker was used to circumscribe the incision around the nipple-areola complex. An inferior pedicle measuring 7 centimeters in diameter and approximately 10 centimeters from the medial corner incision was de-epithelialized. The superior flaps were then elevated. They were initially 2.5 to 3 centimeters thick and gradually tapered down to the pectoralis fascia where dissection continued up to the clavicle. The medial, lateral, and superior triangles were removed, taking care to spare some fatty tissue over the lateral chest wall to protect nipple-areola innervation. Some tumescent solution was injected into the lateral chest wall and lateral breasts. A  0 vicryl suture was placed at the 12-o'clock position on the nipple. Hemostasis was achieved using cautery. The breast was copiously irrigated with normal saline and peroxide. A valsalva maneuver was performed bilaterally to check for bleeders. The incisions were closed using a combination of 0 Vycril, 3-0 PDS for the deep dermal layer and a running 3-0 Monocryl for the subcuticular layer. Dermabond was applied to the horizontal and vertical incision lines as well as steri strips.   Left Breast A 42 mm nipple marker was used to circumscribe the incision around the nipple-areola complex. An inferior pedicle measuring 7 centimeters in diameter and approximately 10 centimeters from the medial corner incision was de-epithelialized. The superior flaps were then elevated. They were initially 2.5 to 3 centimeters thick and gradually tapered down to the pectoralis fascia where dissection continued up to the clavicle. The medial, lateral, and superior triangles were removed, taking care to spare some fatty tissue over the lateral chest wall to protect nipple-areola innervation. Some tumescent solution was injected into the lateral chest wall and lateral breasts. A 0 vicryl suture was placed at the 12-o'clock position on the nipple. Hemostasis was achieved using cautery. The breast was copiously irrigated with normal saline and peroxide. A valsalva maneuver was performed bilaterally to check for bleeders. The incisions were closed using a combination of 0 Vycril, 3-0 PDS for the deep dermal layer and a running 3-0 Monocryl for the subcuticular layer. Dermabond was applied to the horizontal and vertical incision lines as well as steri strips.   Some final trimming was performed for symmetry. A total of 243 grams was removed from the right side, and a total of 234 grams was removed from the left side.10 cc of Marcaine  0.25% were injected in each breast. Myriad  Morcels were applied to bilateral lateral incisions.     The  patient was then sat up. 38 mm nipple markers were used to mark the sites for the new nipple-areola complexes, approximately 6 centimeters above the inframammary folds. Symmetry was based on measurements from the suprasternal notch and from the midline in 2 planes. A 42 mm area was de-epithelialized bilaterally. The dermis was incised in a cruciate fashion. The nipple-areola complexes together with the inferior pedicles were then delivered in the resulting defects. Viability of the nipple-areola complexes was assessed as being normal. Interrupted 3-0 Monocryl suture was then used to approximate the dermis. A running 4-0 Monocryl suture and Steri-Strips were used for the skin coaptation. A sterile dressing was applied.   The patient tolerated the procedure well. Instrument, needle and sponge counts were correct at the end of the procedure. There were no intraoperative complications. The patient was transferred to recovery stable and will go home from there.   The advanced practice practitioner (APP) and Dr. Waddell assisted throughout the case.  They were essential in retraction and counter traction when needed to make the case progress smoothly.  This retraction and assistance made it possible to see the tissue plans for the procedure.  The assistance was needed for blood control, tissue re-approximation and assisted with closure of the incision site.    Wendy Phillips M. Wendy Bodine, MD [facility]

## 2024-05-31 NOTE — Interval H&P Note (Signed)
 History and Physical Interval Note:  05/31/2024 2:03 PM  Wendy Phillips  has presented today for surgery, with the diagnosis of n62.  The various methods of treatment have been discussed with the patient and family. After consideration of risks, benefits and other options for treatment, the patient has consented to  Procedure(s): BREAST REDUCTION WITH LIPOSUCTION (Bilateral) as a surgical intervention.  The patient's history has been reviewed, patient examined, no change in status, stable for surgery.  I have reviewed the patient's chart and labs.  Questions were answered to the patient's satisfaction.     [name] M Rosealynn Mateus

## 2024-05-31 NOTE — Transfer of Care (Signed)
 Immediate Anesthesia Transfer of Care Note  Patient: Wendy Phillips  Procedure(s) Performed: BILATERAL BREAST REDUCTION WITH LIPOSUCTION (Bilateral: Breast)  Patient Location: PACU  Anesthesia Type:General  Level of Consciousness: oriented, sedated, and drowsy  Airway & Oxygen Therapy: Patient Spontanous Breathing and Patient connected to face mask oxygen  Post-op Assessment: Report given to RN and Post -op Vital signs reviewed and stable  Post vital signs: Reviewed and stable  Last Vitals:  Vitals Value Taken Time  BP 100/70 05/31/24 18:36  Temp 36.4 C 05/31/24 18:36  Pulse 102 05/31/24 18:39  Resp 13 05/31/24 18:39  SpO2 100 % 05/31/24 18:39  Vitals shown include unfiled device data.  Last Pain:  Vitals:   05/31/24 1116  TempSrc:   PainSc: 0-No pain      Patients Stated Pain Goal: 0 (05/31/24 1116)  Complications: No notable events documented.

## 2024-05-31 NOTE — Discharge Instructions (Addendum)
 Activity  As tolerated: NO showers. Ok to sponge bath. NO driving No heavy activities  Diet:regular No restrictions  Wound Care: Keep dressing clean & dry  Do not change dressings Special Instructions: Do not raise arms up Call Doctor if any unusual problems occur such as pain, excessive Bleeding, unrelieved Nausea/vomiting, Fever &/or chills When lying down, keep head elevated on 2-3 pillows or back-rest Follow-up appointment: Please call the office.  The patient received discharge instruction from:___________________________________________   Patient signature ________________________________________ / Date___________    Signature of individual providing instructions/ Date________________

## 2024-05-31 NOTE — Anesthesia Procedure Notes (Signed)
 Procedure Name: Intubation Date/Time: 05/31/2024 2:22 PM  Performed by: Arvell Edsel HERO, CRNAPre-anesthesia Checklist: Patient identified, Emergency Drugs available, Suction available, Patient being monitored and Timeout performed Patient Re-evaluated:Patient Re-evaluated prior to induction Oxygen Delivery Method: Circle system utilized Preoxygenation: Pre-oxygenation with 100% oxygen Induction Type: IV induction Ventilation: Mask ventilation without difficulty Laryngoscope Size: Mac and 3 Grade View: Grade I Tube type: Oral Tube size: 7.0 mm Number of attempts: 2 Airway Equipment and Method: Patient positioned with wedge pillow and Stylet Placement Confirmation: ETT inserted through vocal cords under direct vision, positive ETCO2 and breath sounds checked- equal and bilateral Secured at: 22 cm Tube secured with: Tape Dental Injury: Teeth and Oropharynx as per pre-operative assessment

## 2024-06-01 ENCOUNTER — Encounter (HOSPITAL_COMMUNITY): Payer: Self-pay

## 2024-06-01 NOTE — Anesthesia Postprocedure Evaluation (Signed)
 Anesthesia Post Note  Patient: Wendy Phillips  Procedure(s) Performed: BILATERAL BREAST REDUCTION WITH LIPOSUCTION (Bilateral: Breast)     Patient location during evaluation: PACU Anesthesia Type: General Level of consciousness: awake and alert Pain management: pain level controlled Vital Signs Assessment: post-procedure vital signs reviewed and stable Respiratory status: spontaneous breathing, nonlabored ventilation, respiratory function stable and patient connected to nasal cannula oxygen Cardiovascular status: blood pressure returned to baseline and stable Postop Assessment: no apparent nausea or vomiting Anesthetic complications: no   No notable events documented.  Last Vitals:  Vitals:   05/31/24 2000 05/31/24 2015  BP: 118/71 121/84  Pulse: 62 89  Resp: 12 14  Temp:  36.4 C  SpO2: 94% 95%    Last Pain:  Vitals:   05/31/24 2000  TempSrc:   PainSc: Asleep                 Wendy Phillips

## 2024-06-02 LAB — SURGICAL PATHOLOGY

## 2024-06-07 ENCOUNTER — Telehealth: Payer: Self-pay

## 2024-06-07 NOTE — Telephone Encounter (Signed)
 Sent message via MyChart requesting photos of blisters.

## 2024-06-07 NOTE — Telephone Encounter (Signed)
 Patient called to follow up from surgery and said that the incision on the left side under her arm pit has a build up blister or fluid, its about the size of a peanut in the shell as she described, She said its hanging out of the incision and she wasn't sure if its something she needs to be seen sooner for. Her appointment 06/09/24 at 10:30am. Please advise, call back is [phone].

## 2024-06-09 ENCOUNTER — Ambulatory Visit

## 2024-06-09 VITALS — BP 113/76 | HR 78

## 2024-06-09 DIAGNOSIS — N62 Hypertrophy of breast: Secondary | ICD-10-CM

## 2024-06-09 DIAGNOSIS — Z9889 Other specified postprocedural states: Secondary | ICD-10-CM

## 2024-06-09 NOTE — Progress Notes (Signed)
   Established Patient Office Visit  Subjective   Patient ID: Wendy Phillips, female    DOB: 12-17-1980  Age: 43 y.o. MRN: [id number]  Chief Complaint  Patient presents with   post op    HPI  S/p bilateral breast reduction on 06/09/2024 Developed a small blister on the left side 1 cm x 1 cm. Otherwise no issues after surgery.  Pain has been under control under current regiment.     Objective:     There were no vitals taken for this visit. BP Readings from Last 3 Encounters:  06/09/24 113/76  05/31/24 121/84  05/13/24 (!) 136/93      Physical Exam MA as chaperone  Bilateral breast incisions C/D/I Bilateral nipple areolar complex well perfused.  Small wound on left breast axillary incision covered with steri-strip.   No results found for any visits on 06/09/24.  Last CBC Lab Results  Component Value Date   WBC 3.6 (L) 05/31/2024   HGB 10.1 (L) 05/31/2024   HCT 30.8 (L) 05/31/2024   MCV 94.2 05/31/2024   MCH 30.9 05/31/2024   RDW 12.7 05/31/2024   PLT 142 (L) 05/31/2024      The 10-year ASCVD risk score (Arnett DK, et al., 2019) is: 0.4%    Assessment & Plan:   Problem List Items Addressed This Visit   None Visit Diagnoses       Macromastia    -  Primary     S/P bilateral breast reduction          Can shower. No heavy lifiting for 6 weeks. Ok to wear sports bra. No underwire. Follow up established.  Katriel Cutsforth M. Audie Wieser, MD [facility]

## 2024-06-23 ENCOUNTER — Encounter: Admitting: Surgical

## 2024-06-23 NOTE — Progress Notes (Unsigned)
 Patient is a 43 year old female who recently underwent bilateral breast reduction with Dr. Montorfano on 05/31/2024.  Patient is a little over 3 weeks postop.  She presents the clinic today for postoperative follow-up.  Patient was last seen in the clinic on 06/09/2024.  At this visit, it was noted that patient developed a small blister on the left side there is otherwise no issues after surgery.  On exam, bilateral breast incisions were clean dry and intact, bilateral NAC's were well-perfused.  Today,

## 2024-06-24 ENCOUNTER — Ambulatory Visit: Admitting: Student

## 2024-06-24 DIAGNOSIS — Z9889 Other specified postprocedural states: Secondary | ICD-10-CM

## 2024-07-16 ENCOUNTER — Ambulatory Visit

## 2024-07-16 ENCOUNTER — Encounter

## 2024-07-16 VITALS — BP 123/82 | HR 83 | Ht 62.0 in | Wt 125.6 lb

## 2024-07-16 DIAGNOSIS — Z9889 Other specified postprocedural states: Secondary | ICD-10-CM

## 2024-07-16 DIAGNOSIS — Z09 Encounter for follow-up examination after completed treatment for conditions other than malignant neoplasm: Secondary | ICD-10-CM

## 2024-07-16 NOTE — Progress Notes (Addendum)
" ° °  Established Patient Office Visit  Subjective   Patient ID: Wendy Phillips, female    DOB: 04-Mar-1981  Age: 44 y.o. MRN: [id number]  Chief Complaint  Patient presents with   Post-op Follow-up    HPI Patient is a 44 year old female who recently underwent bilateral breast reduction on 05/31/2024. She presents the clinic today for postoperative follow-up.  She has been doing very well after surgery and is very pleased with the results yet states she is still having a little bit of discomfort on the sides.  Patient reports this going forward on the medial inframammary fold incision due to a stitch protruding and getting caught in the sports bra.  She also reports that she has a stitch in the vertical limb of the left breast that is protruding and causing discomfort.     Objective:     BP 123/82 (BP Location: Left Arm, Patient Position: Sitting, Cuff Size: Normal)   Pulse 83   Ht 5' 2 (1.575 m)   Wt 125 lb 9.6 oz (57 kg)   SpO2 97%   BMI 22.97 kg/m  BP Readings from Last 3 Encounters:  07/16/24 123/82  06/09/24 113/76  05/31/24 121/84    Physical Exam MA as chaperone  Bilateral breast incisions well-healed, clean dry intact, noticed a PDS stitch protruding from the medial aspect of the right inframammary fold incision as well as a stitch in the vertical limb of the left breast.  This stitches were removed in clinic, after obtaining verbal consent from the patient, as well as a small amount of excess skin on medial IMF of right breast under sterile conditions and using 2 cc of Lidocaine  with epinephrine  1%, a few interrupted 4 nylon's were placed to close the skin. This was covered with sterile dressing. Nipple areolar complex appeared to be healthy and well-perfused.  The 10-year ASCVD risk score (Arnett DK, et al., 2019) is: 0.5%    Assessment & Plan:   Problem List Items Addressed This Visit   None Visit Diagnoses       Follow-up exam    -  Primary     S/P bilateral  breast reduction          Recommend to apply Vaseline on the scars and massage the scars except in the areas where the stitches were removed today.  Continue compression bra until follow-up. All questions answered to patient satisfaction. Patient will follow-up with me in 2 weeks for removal of nylons.  Vash Quezada M David Towson, MD  "

## 2024-07-29 ENCOUNTER — Ambulatory Visit (INDEPENDENT_AMBULATORY_CARE_PROVIDER_SITE_OTHER)

## 2024-07-29 VITALS — BP 107/73 | HR 74

## 2024-07-29 DIAGNOSIS — Z9889 Other specified postprocedural states: Secondary | ICD-10-CM

## 2024-07-29 DIAGNOSIS — Z09 Encounter for follow-up examination after completed treatment for conditions other than malignant neoplasm: Secondary | ICD-10-CM

## 2024-07-29 NOTE — Progress Notes (Signed)
" ° °  Established Patient Office Visit  Subjective   Patient ID: Wendy Phillips, female    DOB: Feb 07, 1981  Age: 44 y.o. MRN: [id number]  Chief Complaint  Patient presents with   Post-op Follow-up    HPI  Patient is a 44 year old female who recently underwent bilateral breast reduction on 05/31/2024. She presents the clinic today for postoperative follow-up, and for removal of Nylon stitches from medial right IMF.    Objective:     BP 107/73 (BP Location: Left Arm, Patient Position: Sitting, Cuff Size: Normal)   Pulse 74   SpO2 100%  BP Readings from Last 3 Encounters:  07/29/24 107/73  07/16/24 123/82  06/09/24 113/76    Physical Exam Bilateral breast incisions well-healed, clean dry intact. Nipples well perfused.  The Nylon stitches from the in office procedure were removed, patient tolerated well.   The 10-year ASCVD risk score (Arnett DK, et al., 2019) is: 0.4%    Assessment & Plan:   Problem List Items Addressed This Visit   None Visit Diagnoses       Follow-up exam    -  Primary     S/P bilateral breast reduction          Recommend to apply Vaseline on the scars and massage the scar on left lateral chest for 6 months. Can wear regular bra. Can exercise and go back to work.  All questions answered to patient satisfaction. Patient will follow-up with me in 1 month  Nieves CHRISTELLA Client, MD  "

## 2024-07-30 ENCOUNTER — Encounter

## 2024-08-18 ENCOUNTER — Ambulatory Visit: Admitting: Dermatology

## 2024-08-18 ENCOUNTER — Encounter

## 2024-08-25 ENCOUNTER — Encounter

## 2024-08-27 ENCOUNTER — Encounter

## 2024-09-29 ENCOUNTER — Ambulatory Visit: Admitting: Dermatology

## 2024-10-01 ENCOUNTER — Encounter
